# Patient Record
Sex: Female | Born: 1985 | Race: White | Hispanic: No | Marital: Single | Smoking: Current every day smoker
Health system: Southern US, Community
[De-identification: ages and names within clinical notes are randomized; demographics above are authoritative.]

## PROBLEM LIST (undated history)

## (undated) DIAGNOSIS — R51 Headache: Secondary | ICD-10-CM

## (undated) DIAGNOSIS — N200 Calculus of kidney: Secondary | ICD-10-CM

## (undated) DIAGNOSIS — F32A Depression, unspecified: Secondary | ICD-10-CM

## (undated) DIAGNOSIS — F329 Major depressive disorder, single episode, unspecified: Secondary | ICD-10-CM

## (undated) DIAGNOSIS — N159 Renal tubulo-interstitial disease, unspecified: Secondary | ICD-10-CM

## (undated) DIAGNOSIS — F53 Postpartum depression: Secondary | ICD-10-CM

## (undated) DIAGNOSIS — N390 Urinary tract infection, site not specified: Secondary | ICD-10-CM

## (undated) DIAGNOSIS — F4310 Post-traumatic stress disorder, unspecified: Secondary | ICD-10-CM

## (undated) DIAGNOSIS — N3010 Interstitial cystitis (chronic) without hematuria: Secondary | ICD-10-CM

## (undated) DIAGNOSIS — F419 Anxiety disorder, unspecified: Secondary | ICD-10-CM

## (undated) DIAGNOSIS — O99345 Other mental disorders complicating the puerperium: Secondary | ICD-10-CM

## (undated) DIAGNOSIS — C539 Malignant neoplasm of cervix uteri, unspecified: Secondary | ICD-10-CM

## (undated) HISTORY — PX: CERVIX SURGERY: SHX593

## (undated) HISTORY — DX: Major depressive disorder, single episode, unspecified: F32.9

## (undated) HISTORY — DX: Urinary tract infection, site not specified: N39.0

## (undated) HISTORY — PX: BLADDER SURGERY: SHX569

## (undated) HISTORY — DX: Headache: R51

## (undated) HISTORY — DX: Depression, unspecified: F32.A

---

## 2004-12-11 ENCOUNTER — Ambulatory Visit: Payer: Self-pay | Admitting: Pulmonary Disease

## 2005-06-24 ENCOUNTER — Emergency Department (HOSPITAL_COMMUNITY): Admission: EM | Admit: 2005-06-24 | Discharge: 2005-06-24 | Payer: Self-pay | Admitting: Emergency Medicine

## 2007-06-20 ENCOUNTER — Emergency Department (HOSPITAL_COMMUNITY): Admission: EM | Admit: 2007-06-20 | Discharge: 2007-06-21 | Payer: Self-pay | Admitting: Emergency Medicine

## 2008-03-28 ENCOUNTER — Ambulatory Visit: Payer: Self-pay | Admitting: Psychiatry

## 2008-03-28 ENCOUNTER — Emergency Department (HOSPITAL_COMMUNITY): Admission: EM | Admit: 2008-03-28 | Discharge: 2008-03-28 | Payer: Self-pay | Admitting: Emergency Medicine

## 2008-03-28 ENCOUNTER — Inpatient Hospital Stay (HOSPITAL_COMMUNITY): Admission: RE | Admit: 2008-03-28 | Discharge: 2008-04-04 | Payer: Self-pay | Admitting: Psychiatry

## 2008-04-29 ENCOUNTER — Emergency Department (HOSPITAL_BASED_OUTPATIENT_CLINIC_OR_DEPARTMENT_OTHER): Admission: EM | Admit: 2008-04-29 | Discharge: 2008-04-29 | Payer: Self-pay | Admitting: Emergency Medicine

## 2008-05-12 ENCOUNTER — Ambulatory Visit: Payer: Self-pay | Admitting: Diagnostic Radiology

## 2008-05-12 ENCOUNTER — Emergency Department (HOSPITAL_BASED_OUTPATIENT_CLINIC_OR_DEPARTMENT_OTHER): Admission: EM | Admit: 2008-05-12 | Discharge: 2008-05-12 | Payer: Self-pay | Admitting: Emergency Medicine

## 2008-06-27 ENCOUNTER — Emergency Department (HOSPITAL_BASED_OUTPATIENT_CLINIC_OR_DEPARTMENT_OTHER): Admission: EM | Admit: 2008-06-27 | Discharge: 2008-06-27 | Payer: Self-pay | Admitting: Emergency Medicine

## 2008-07-16 ENCOUNTER — Emergency Department (HOSPITAL_COMMUNITY): Admission: EM | Admit: 2008-07-16 | Discharge: 2008-07-16 | Payer: Self-pay | Admitting: Emergency Medicine

## 2008-08-01 ENCOUNTER — Emergency Department (HOSPITAL_BASED_OUTPATIENT_CLINIC_OR_DEPARTMENT_OTHER): Admission: EM | Admit: 2008-08-01 | Discharge: 2008-08-01 | Payer: Self-pay | Admitting: Emergency Medicine

## 2008-08-26 ENCOUNTER — Emergency Department (HOSPITAL_BASED_OUTPATIENT_CLINIC_OR_DEPARTMENT_OTHER): Admission: EM | Admit: 2008-08-26 | Discharge: 2008-08-26 | Payer: Self-pay | Admitting: Emergency Medicine

## 2008-10-08 ENCOUNTER — Emergency Department (HOSPITAL_BASED_OUTPATIENT_CLINIC_OR_DEPARTMENT_OTHER): Admission: EM | Admit: 2008-10-08 | Discharge: 2008-10-08 | Payer: Self-pay | Admitting: Emergency Medicine

## 2008-10-08 ENCOUNTER — Ambulatory Visit: Payer: Self-pay | Admitting: Diagnostic Radiology

## 2008-10-25 ENCOUNTER — Emergency Department (HOSPITAL_BASED_OUTPATIENT_CLINIC_OR_DEPARTMENT_OTHER): Admission: EM | Admit: 2008-10-25 | Discharge: 2008-10-25 | Payer: Self-pay | Admitting: Emergency Medicine

## 2008-12-02 ENCOUNTER — Emergency Department (HOSPITAL_BASED_OUTPATIENT_CLINIC_OR_DEPARTMENT_OTHER): Admission: EM | Admit: 2008-12-02 | Discharge: 2008-12-02 | Payer: Self-pay | Admitting: Emergency Medicine

## 2008-12-06 ENCOUNTER — Emergency Department (HOSPITAL_BASED_OUTPATIENT_CLINIC_OR_DEPARTMENT_OTHER): Admission: EM | Admit: 2008-12-06 | Discharge: 2008-12-06 | Payer: Self-pay | Admitting: Emergency Medicine

## 2008-12-27 ENCOUNTER — Emergency Department (HOSPITAL_BASED_OUTPATIENT_CLINIC_OR_DEPARTMENT_OTHER): Admission: EM | Admit: 2008-12-27 | Discharge: 2008-12-27 | Payer: Self-pay | Admitting: Emergency Medicine

## 2009-01-13 ENCOUNTER — Emergency Department (HOSPITAL_BASED_OUTPATIENT_CLINIC_OR_DEPARTMENT_OTHER): Admission: EM | Admit: 2009-01-13 | Discharge: 2009-01-13 | Payer: Self-pay | Admitting: Emergency Medicine

## 2009-10-01 ENCOUNTER — Emergency Department (HOSPITAL_BASED_OUTPATIENT_CLINIC_OR_DEPARTMENT_OTHER): Admission: EM | Admit: 2009-10-01 | Discharge: 2009-10-01 | Payer: Self-pay | Admitting: Emergency Medicine

## 2009-10-15 ENCOUNTER — Encounter (INDEPENDENT_AMBULATORY_CARE_PROVIDER_SITE_OTHER): Payer: Self-pay | Admitting: Family Medicine

## 2009-10-15 ENCOUNTER — Ambulatory Visit: Payer: Self-pay | Admitting: Internal Medicine

## 2009-10-15 LAB — CONVERTED CEMR LAB
ALT: 47 units/L — ABNORMAL HIGH (ref 0–35)
AST: 32 units/L (ref 0–37)
Albumin: 4.6 g/dL (ref 3.5–5.2)
Alkaline Phosphatase: 92 units/L (ref 39–117)
BUN: 10 mg/dL (ref 6–23)
Basophils Absolute: 0 10*3/uL (ref 0.0–0.1)
Basophils Relative: 0 % (ref 0–1)
CO2: 27 meq/L (ref 19–32)
Calcium: 9.4 mg/dL (ref 8.4–10.5)
Chlamydia, Swab/Urine, PCR: NEGATIVE
Chloride: 103 meq/L (ref 96–112)
Creatinine, Ser: 0.85 mg/dL (ref 0.40–1.20)
Eosinophils Absolute: 0.4 10*3/uL (ref 0.0–0.7)
Eosinophils Relative: 5 % (ref 0–5)
GC Probe Amp, Urine: NEGATIVE
Glucose, Bld: 87 mg/dL (ref 70–99)
HCT: 36.3 % (ref 36.0–46.0)
HCV Ab: NEGATIVE
Hemoglobin: 12.2 g/dL (ref 12.0–15.0)
Hep A Total Ab: NEGATIVE
Hep B Core Total Ab: NEGATIVE
Hep B S Ab: NEGATIVE
Lymphocytes Relative: 38 % (ref 12–46)
Lymphs Abs: 2.9 10*3/uL (ref 0.7–4.0)
MCHC: 33.6 g/dL (ref 30.0–36.0)
MCV: 83.1 fL (ref 78.0–100.0)
Monocytes Absolute: 0.7 10*3/uL (ref 0.1–1.0)
Monocytes Relative: 9 % (ref 3–12)
Neutro Abs: 3.7 10*3/uL (ref 1.7–7.7)
Neutrophils Relative %: 48 % (ref 43–77)
Platelets: 304 10*3/uL (ref 150–400)
Potassium: 4.2 meq/L (ref 3.5–5.3)
RBC: 4.37 M/uL (ref 3.87–5.11)
RDW: 12.7 % (ref 11.5–15.5)
Sodium: 139 meq/L (ref 135–145)
TSH: 1.778 microintl units/mL (ref 0.350–4.500)
Total Bilirubin: 0.4 mg/dL (ref 0.3–1.2)
Total Protein: 7.3 g/dL (ref 6.0–8.3)
WBC: 7.7 10*3/uL (ref 4.0–10.5)

## 2009-10-19 ENCOUNTER — Encounter: Admission: RE | Admit: 2009-10-19 | Discharge: 2009-10-19 | Payer: Self-pay | Admitting: Internal Medicine

## 2009-11-01 ENCOUNTER — Encounter: Admission: RE | Admit: 2009-11-01 | Discharge: 2009-11-01 | Payer: Self-pay | Admitting: Internal Medicine

## 2009-11-15 ENCOUNTER — Ambulatory Visit: Payer: Self-pay | Admitting: Internal Medicine

## 2009-11-30 ENCOUNTER — Ambulatory Visit: Payer: Self-pay | Admitting: Family Medicine

## 2009-12-01 ENCOUNTER — Encounter (INDEPENDENT_AMBULATORY_CARE_PROVIDER_SITE_OTHER): Payer: Self-pay | Admitting: Internal Medicine

## 2009-12-29 ENCOUNTER — Emergency Department (HOSPITAL_BASED_OUTPATIENT_CLINIC_OR_DEPARTMENT_OTHER): Admission: EM | Admit: 2009-12-29 | Discharge: 2009-12-29 | Payer: Self-pay | Admitting: Emergency Medicine

## 2009-12-29 ENCOUNTER — Ambulatory Visit: Payer: Self-pay | Admitting: Diagnostic Radiology

## 2010-01-18 ENCOUNTER — Encounter (INDEPENDENT_AMBULATORY_CARE_PROVIDER_SITE_OTHER): Payer: Self-pay | Admitting: *Deleted

## 2010-01-18 LAB — CONVERTED CEMR LAB
Chlamydia, DNA Probe: NEGATIVE
Chlamydia, Swab/Urine, PCR: NEGATIVE
GC Probe Amp, Genital: NEGATIVE
GC Probe Amp, Urine: NEGATIVE

## 2010-01-25 ENCOUNTER — Emergency Department (HOSPITAL_BASED_OUTPATIENT_CLINIC_OR_DEPARTMENT_OTHER)
Admission: EM | Admit: 2010-01-25 | Discharge: 2010-01-25 | Payer: Self-pay | Source: Home / Self Care | Admitting: Emergency Medicine

## 2010-02-09 ENCOUNTER — Emergency Department (HOSPITAL_BASED_OUTPATIENT_CLINIC_OR_DEPARTMENT_OTHER)
Admission: EM | Admit: 2010-02-09 | Discharge: 2010-02-09 | Payer: Self-pay | Source: Home / Self Care | Admitting: Emergency Medicine

## 2010-03-14 ENCOUNTER — Emergency Department (HOSPITAL_BASED_OUTPATIENT_CLINIC_OR_DEPARTMENT_OTHER)
Admission: EM | Admit: 2010-03-14 | Discharge: 2010-03-14 | Payer: Self-pay | Source: Home / Self Care | Admitting: Emergency Medicine

## 2010-05-12 ENCOUNTER — Emergency Department (HOSPITAL_BASED_OUTPATIENT_CLINIC_OR_DEPARTMENT_OTHER)
Admission: EM | Admit: 2010-05-12 | Discharge: 2010-05-12 | Disposition: A | Discharge status: Home / Self Care | Payer: Self-pay | Attending: Emergency Medicine | Admitting: Emergency Medicine

## 2010-05-12 DIAGNOSIS — J40 Bronchitis, not specified as acute or chronic: Secondary | ICD-10-CM | POA: Insufficient documentation

## 2010-05-12 DIAGNOSIS — R059 Cough, unspecified: Secondary | ICD-10-CM | POA: Insufficient documentation

## 2010-05-12 DIAGNOSIS — R05 Cough: Secondary | ICD-10-CM | POA: Insufficient documentation

## 2010-05-12 DIAGNOSIS — F17200 Nicotine dependence, unspecified, uncomplicated: Secondary | ICD-10-CM | POA: Insufficient documentation

## 2010-05-12 DIAGNOSIS — F319 Bipolar disorder, unspecified: Secondary | ICD-10-CM | POA: Insufficient documentation

## 2010-05-12 DIAGNOSIS — F411 Generalized anxiety disorder: Secondary | ICD-10-CM | POA: Insufficient documentation

## 2010-05-24 ENCOUNTER — Emergency Department (HOSPITAL_BASED_OUTPATIENT_CLINIC_OR_DEPARTMENT_OTHER)
Admission: EM | Admit: 2010-05-24 | Discharge: 2010-05-24 | Disposition: A | Discharge status: Home / Self Care | Payer: Self-pay | Attending: Emergency Medicine | Admitting: Emergency Medicine

## 2010-06-01 ENCOUNTER — Emergency Department (HOSPITAL_BASED_OUTPATIENT_CLINIC_OR_DEPARTMENT_OTHER)
Admission: EM | Admit: 2010-06-01 | Discharge: 2010-06-01 | Disposition: A | Discharge status: Home / Self Care | Payer: Self-pay | Attending: Emergency Medicine | Admitting: Emergency Medicine

## 2010-06-01 DIAGNOSIS — R350 Frequency of micturition: Secondary | ICD-10-CM | POA: Insufficient documentation

## 2010-06-01 DIAGNOSIS — Z3201 Encounter for pregnancy test, result positive: Secondary | ICD-10-CM | POA: Insufficient documentation

## 2010-06-01 LAB — URINALYSIS, ROUTINE W REFLEX MICROSCOPIC
Bilirubin Urine: NEGATIVE
Ketones, ur: 15 mg/dL — AB
Nitrite: NEGATIVE
Protein, ur: NEGATIVE mg/dL
Specific Gravity, Urine: 1.03 (ref 1.005–1.030)
pH: 6 (ref 5.0–8.0)

## 2010-06-01 LAB — PREGNANCY, URINE: Preg Test, Ur: POSITIVE

## 2010-06-03 LAB — CBC
HCT: 33.5 % — ABNORMAL LOW (ref 36.0–46.0)
Hemoglobin: 11.1 g/dL — ABNORMAL LOW (ref 12.0–15.0)
MCH: 28.5 pg (ref 26.0–34.0)
MCHC: 33.1 g/dL (ref 30.0–36.0)
MCV: 85.9 fL (ref 78.0–100.0)
Platelets: 341 10*3/uL (ref 150–400)
RBC: 3.9 MIL/uL (ref 3.87–5.11)
RDW: 14.3 % (ref 11.5–15.5)
WBC: 7.9 10*3/uL (ref 4.0–10.5)

## 2010-06-03 LAB — URINE CULTURE
Colony Count: 85000
Colony Count: >=100000
Culture  Setup Time: 201112230113
Culture  Setup Time: 201112230114

## 2010-06-03 LAB — URINALYSIS, ROUTINE W REFLEX MICROSCOPIC
Bilirubin Urine: NEGATIVE
Bilirubin Urine: NEGATIVE
Glucose, UA: NEGATIVE mg/dL
Glucose, UA: NEGATIVE mg/dL
Ketones, ur: 15 mg/dL — AB
Ketones, ur: NEGATIVE mg/dL
Nitrite: POSITIVE — AB
Nitrite: POSITIVE — AB
Protein, ur: NEGATIVE mg/dL
Protein, ur: NEGATIVE mg/dL
Specific Gravity, Urine: 1.008 (ref 1.005–1.030)
Specific Gravity, Urine: 1.014 (ref 1.005–1.030)
Urobilinogen, UA: 1 mg/dL (ref 0.0–1.0)
Urobilinogen, UA: 1 mg/dL (ref 0.0–1.0)
pH: 5.5 (ref 5.0–8.0)
pH: 6.5 (ref 5.0–8.0)

## 2010-06-03 LAB — BASIC METABOLIC PANEL
BUN: 13 mg/dL (ref 6–23)
CO2: 26 mEq/L (ref 19–32)
Calcium: 9.4 mg/dL (ref 8.4–10.5)
Chloride: 105 mEq/L (ref 96–112)
Creatinine, Ser: 0.8 mg/dL (ref 0.4–1.2)
GFR calc Af Amer: >60 mL/min (ref >60)
GFR calc non Af Amer: >60 mL/min (ref >60)
Glucose, Bld: 101 mg/dL — ABNORMAL HIGH (ref 70–99)
Potassium: 4.6 mEq/L (ref 3.5–5.1)
Sodium: 143 mEq/L (ref 135–145)

## 2010-06-03 LAB — URINE MICROSCOPIC-ADD ON

## 2010-06-03 LAB — DIFFERENTIAL
Basophils Absolute: 0 10*3/uL (ref 0.0–0.1)
Basophils Relative: 0 % (ref 0–1)
Eosinophils Absolute: 0.3 10*3/uL (ref 0.0–0.7)
Eosinophils Relative: 4 % (ref 0–5)
Lymphocytes Relative: 40 % (ref 12–46)
Lymphs Abs: 3.2 10*3/uL (ref 0.7–4.0)
Monocytes Absolute: 0.5 10*3/uL (ref 0.1–1.0)
Monocytes Relative: 7 % (ref 3–12)
Neutro Abs: 3.8 10*3/uL (ref 1.7–7.7)
Neutrophils Relative %: 49 % (ref 43–77)

## 2010-06-03 LAB — PREGNANCY, URINE: Preg Test, Ur: NEGATIVE

## 2010-06-04 LAB — URINALYSIS, ROUTINE W REFLEX MICROSCOPIC
Bilirubin Urine: NEGATIVE
Glucose, UA: NEGATIVE mg/dL
Glucose, UA: NEGATIVE mg/dL
Hgb urine dipstick: NEGATIVE
Hgb urine dipstick: NEGATIVE
Ketones, ur: 15 mg/dL — AB
Ketones, ur: 15 mg/dL — AB
Nitrite: POSITIVE — AB
Protein, ur: NEGATIVE mg/dL
Protein, ur: NEGATIVE mg/dL
Specific Gravity, Urine: 1.017 (ref 1.005–1.030)
Urobilinogen, UA: 1 mg/dL (ref 0.0–1.0)
Urobilinogen, UA: 4 mg/dL — ABNORMAL HIGH (ref 0.0–1.0)
pH: 5.5 (ref 5.0–8.0)

## 2010-06-04 LAB — URINE CULTURE
Colony Count: NO GROWTH
Colony Count: NO GROWTH
Culture  Setup Time: 201111041817
Culture  Setup Time: 201111201132
Culture: NO GROWTH
Culture: NO GROWTH

## 2010-06-04 LAB — PREGNANCY, URINE: Preg Test, Ur: NEGATIVE

## 2010-06-04 LAB — URINE MICROSCOPIC-ADD ON

## 2010-06-06 LAB — DIFFERENTIAL
Eosinophils Absolute: 0.3 10*3/uL (ref 0.0–0.7)
Eosinophils Relative: 4 % (ref 0–5)
Lymphs Abs: 2.2 10*3/uL (ref 0.7–4.0)
Monocytes Relative: 7 % (ref 3–12)

## 2010-06-06 LAB — CBC
Hemoglobin: 12.1 g/dL (ref 12.0–15.0)
MCHC: 33.3 g/dL (ref 30.0–36.0)
RDW: 12.1 % (ref 11.5–15.5)
WBC: 6.6 10*3/uL (ref 4.0–10.5)

## 2010-06-06 LAB — COMPREHENSIVE METABOLIC PANEL
ALT: 34 U/L (ref 0–35)
AST: 28 U/L (ref 0–37)
Alkaline Phosphatase: 118 U/L — ABNORMAL HIGH (ref 39–117)
CO2: 26 mEq/L (ref 19–32)
Calcium: 9.5 mg/dL (ref 8.4–10.5)
GFR calc Af Amer: >60 mL/min (ref >60)
Potassium: 4.1 mEq/L (ref 3.5–5.1)
Sodium: 142 mEq/L (ref 135–145)
Total Protein: 7.4 g/dL (ref 6.0–8.3)

## 2010-06-06 LAB — URINE MICROSCOPIC-ADD ON

## 2010-06-06 LAB — URINALYSIS, ROUTINE W REFLEX MICROSCOPIC
Glucose, UA: NEGATIVE mg/dL
Ketones, ur: 15 mg/dL — AB
Protein, ur: NEGATIVE mg/dL
pH: 6 (ref 5.0–8.0)

## 2010-06-09 LAB — URINALYSIS, ROUTINE W REFLEX MICROSCOPIC
Glucose, UA: NEGATIVE mg/dL
Ketones, ur: 40 mg/dL — AB
pH: 5 (ref 5.0–8.0)

## 2010-06-09 LAB — URINE MICROSCOPIC-ADD ON

## 2010-06-27 LAB — URINALYSIS, ROUTINE W REFLEX MICROSCOPIC
Hgb urine dipstick: NEGATIVE
Protein, ur: 30 mg/dL — AB
Urobilinogen, UA: >8 mg/dL — ABNORMAL HIGH (ref 0.0–1.0)

## 2010-06-27 LAB — URINE CULTURE: Culture: NO GROWTH

## 2010-06-27 LAB — URINE MICROSCOPIC-ADD ON

## 2010-06-27 LAB — PREGNANCY, URINE: Preg Test, Ur: NEGATIVE

## 2010-06-28 LAB — URINALYSIS, ROUTINE W REFLEX MICROSCOPIC
Bilirubin Urine: NEGATIVE
Hgb urine dipstick: NEGATIVE
Protein, ur: NEGATIVE mg/dL
Urobilinogen, UA: 1 mg/dL (ref 0.0–1.0)

## 2010-06-28 LAB — URINE MICROSCOPIC-ADD ON

## 2010-06-29 LAB — URINE MICROSCOPIC-ADD ON

## 2010-06-29 LAB — PREGNANCY, URINE: Preg Test, Ur: NEGATIVE

## 2010-06-29 LAB — URINALYSIS, ROUTINE W REFLEX MICROSCOPIC
Nitrite: POSITIVE — AB
Specific Gravity, Urine: 1.016 (ref 1.005–1.030)
pH: 6.5 (ref 5.0–8.0)

## 2010-06-30 LAB — URINALYSIS, ROUTINE W REFLEX MICROSCOPIC
Glucose, UA: NEGATIVE mg/dL
Hgb urine dipstick: NEGATIVE
Ketones, ur: NEGATIVE mg/dL
Protein, ur: NEGATIVE mg/dL

## 2010-07-01 LAB — URINALYSIS, ROUTINE W REFLEX MICROSCOPIC
Nitrite: POSITIVE — AB
Specific Gravity, Urine: 1.045 — ABNORMAL HIGH (ref 1.005–1.030)
pH: 5.5 (ref 5.0–8.0)

## 2010-07-01 LAB — URINE MICROSCOPIC-ADD ON

## 2010-07-01 LAB — URINE CULTURE

## 2010-07-02 LAB — URINALYSIS, ROUTINE W REFLEX MICROSCOPIC
Nitrite: NEGATIVE
Specific Gravity, Urine: 1.005 (ref 1.005–1.030)
Urobilinogen, UA: 0.2 mg/dL (ref 0.0–1.0)

## 2010-07-02 LAB — PREGNANCY, URINE: Preg Test, Ur: NEGATIVE

## 2010-07-03 LAB — URINALYSIS, ROUTINE W REFLEX MICROSCOPIC
Glucose, UA: NEGATIVE mg/dL
Protein, ur: NEGATIVE mg/dL
Specific Gravity, Urine: 1.018 (ref 1.005–1.030)
pH: 6 (ref 5.0–8.0)

## 2010-07-03 LAB — RAPID URINE DRUG SCREEN, HOSP PERFORMED
Barbiturates: NOT DETECTED
Opiates: POSITIVE — AB

## 2010-07-03 LAB — CBC
Hemoglobin: 13.5 g/dL (ref 12.0–15.0)
MCHC: 34.2 g/dL (ref 30.0–36.0)
MCV: 85.8 fL (ref 78.0–100.0)
RBC: 4.6 MIL/uL (ref 3.87–5.11)
WBC: 8.9 10*3/uL (ref 4.0–10.5)

## 2010-07-03 LAB — DIFFERENTIAL
Basophils Relative: 0 % (ref 0–1)
Lymphs Abs: 1.8 10*3/uL (ref 0.7–4.0)
Monocytes Absolute: 0.6 10*3/uL (ref 0.1–1.0)
Monocytes Relative: 6 % (ref 3–12)
Neutro Abs: 6.5 10*3/uL (ref 1.7–7.7)

## 2010-07-03 LAB — URINE MICROSCOPIC-ADD ON

## 2010-07-03 LAB — BASIC METABOLIC PANEL
CO2: 27 mEq/L (ref 19–32)
Calcium: 9.4 mg/dL (ref 8.4–10.5)
Chloride: 115 mEq/L — ABNORMAL HIGH (ref 96–112)
GFR calc Af Amer: >60 mL/min (ref >60)
Sodium: 147 mEq/L — ABNORMAL HIGH (ref 135–145)

## 2010-07-08 LAB — URINALYSIS, ROUTINE W REFLEX MICROSCOPIC
Glucose, UA: NEGATIVE mg/dL
Glucose, UA: NEGATIVE mg/dL
Hgb urine dipstick: NEGATIVE
Nitrite: NEGATIVE
Protein, ur: NEGATIVE mg/dL
Specific Gravity, Urine: 1.014 (ref 1.005–1.030)
pH: 6 (ref 5.0–8.0)
pH: 6 (ref 5.0–8.0)

## 2010-07-08 LAB — BASIC METABOLIC PANEL
Chloride: 110 mEq/L (ref 96–112)
GFR calc Af Amer: >60 mL/min (ref >60)
GFR calc non Af Amer: >60 mL/min (ref >60)
Potassium: 4.1 mEq/L (ref 3.5–5.1)
Sodium: 141 mEq/L (ref 135–145)

## 2010-07-08 LAB — CBC
HCT: 40.2 % (ref 36.0–46.0)
MCV: 86.4 fL (ref 78.0–100.0)
Platelets: 242 10*3/uL (ref 150–400)
RBC: 4.65 MIL/uL (ref 3.87–5.11)
WBC: 9 10*3/uL (ref 4.0–10.5)

## 2010-07-08 LAB — RAPID URINE DRUG SCREEN, HOSP PERFORMED: Barbiturates: NOT DETECTED

## 2010-07-08 LAB — ETHANOL: Alcohol, Ethyl (B): <5 mg/dL (ref 0–10)

## 2010-07-08 LAB — TRICYCLICS SCREEN, URINE: TCA Scrn: NOT DETECTED

## 2010-07-08 LAB — DIFFERENTIAL
Eosinophils Absolute: 0.5 10*3/uL (ref 0.0–0.7)
Eosinophils Relative: 5 % (ref 0–5)
Lymphocytes Relative: 31 % (ref 12–46)
Lymphs Abs: 2.8 10*3/uL (ref 0.7–4.0)
Monocytes Relative: 5 % (ref 3–12)

## 2010-07-09 LAB — URINALYSIS, ROUTINE W REFLEX MICROSCOPIC
Nitrite: POSITIVE — AB
Specific Gravity, Urine: 1.01 (ref 1.005–1.030)
Urobilinogen, UA: 4 mg/dL — ABNORMAL HIGH (ref 0.0–1.0)
pH: 5 (ref 5.0–8.0)

## 2010-07-09 LAB — URINE MICROSCOPIC-ADD ON
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
## 2010-12-16 LAB — URINE MICROSCOPIC-ADD ON

## 2010-12-16 LAB — URINALYSIS, ROUTINE W REFLEX MICROSCOPIC
Hgb urine dipstick: NEGATIVE
Nitrite: NEGATIVE
Protein, ur: NEGATIVE
Urobilinogen, UA: 0.2

## 2010-12-16 LAB — DIFFERENTIAL
Eosinophils Absolute: 0.2
Lymphocytes Relative: 25
Lymphs Abs: 3.9
Neutro Abs: 10.4 — ABNORMAL HIGH
Neutrophils Relative %: 68

## 2010-12-16 LAB — RAPID URINE DRUG SCREEN, HOSP PERFORMED
Amphetamines: NOT DETECTED
Opiates: POSITIVE — AB
Tetrahydrocannabinol: NOT DETECTED

## 2010-12-16 LAB — BASIC METABOLIC PANEL
BUN: 8
Calcium: 9.1
Creatinine, Ser: <0.3 — ABNORMAL LOW
GFR calc non Af Amer: >60
Glucose, Bld: 51 — ABNORMAL LOW

## 2010-12-16 LAB — PREGNANCY, URINE: Preg Test, Ur: NEGATIVE

## 2010-12-16 LAB — CBC
Platelets: 267
RBC: 5.03
WBC: 15.4 — ABNORMAL HIGH

## 2010-12-16 LAB — ETHANOL: Alcohol, Ethyl (B): <5

## 2011-01-10 IMAGING — CR DG ANKLE COMPLETE 3+V*L*
4 series · 4 of 4 positions shown · non-contrast
Comparison: None

CLINICAL DATA: Fall with left ankle pain.

LEFT ANKLE COMPLETE - 3+ VIEW

[t ankle joint ap left (1 of 2)]
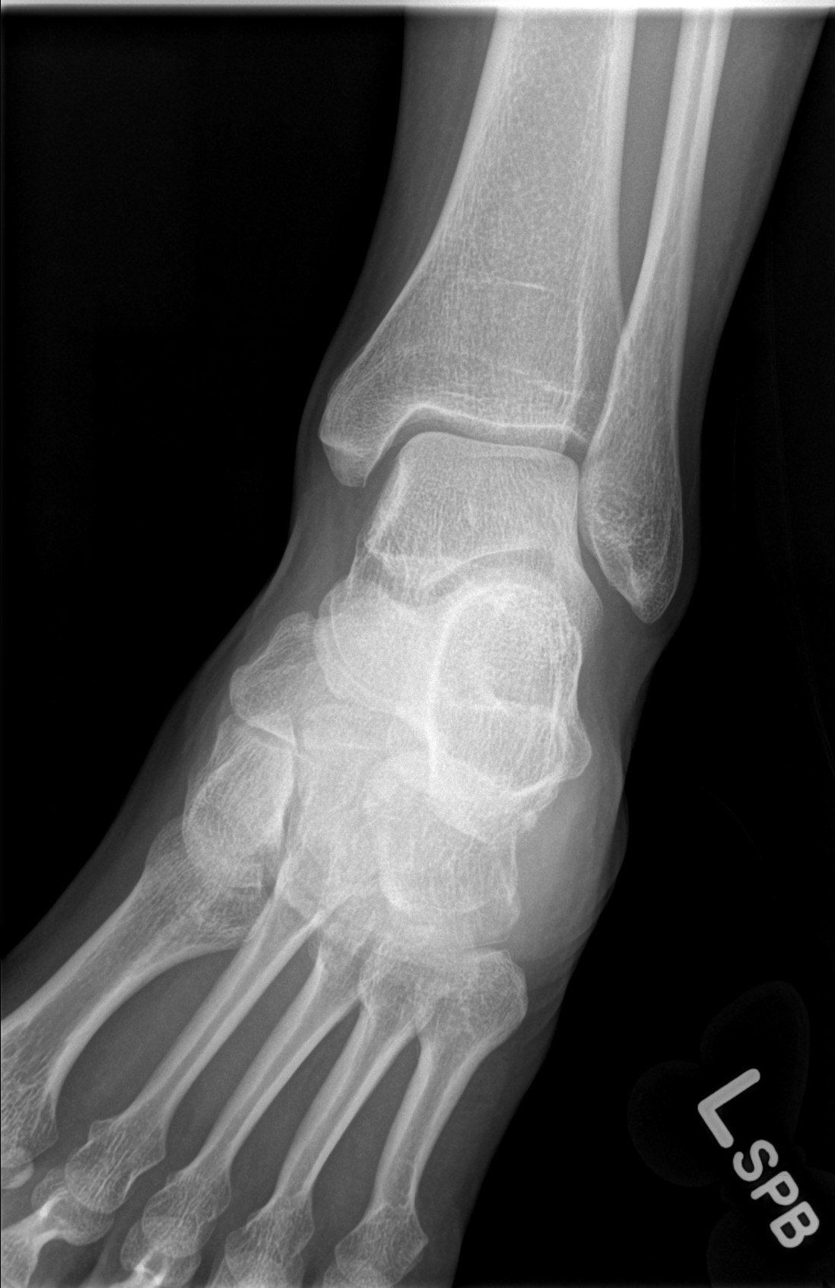

[t ankle joint oblique left]
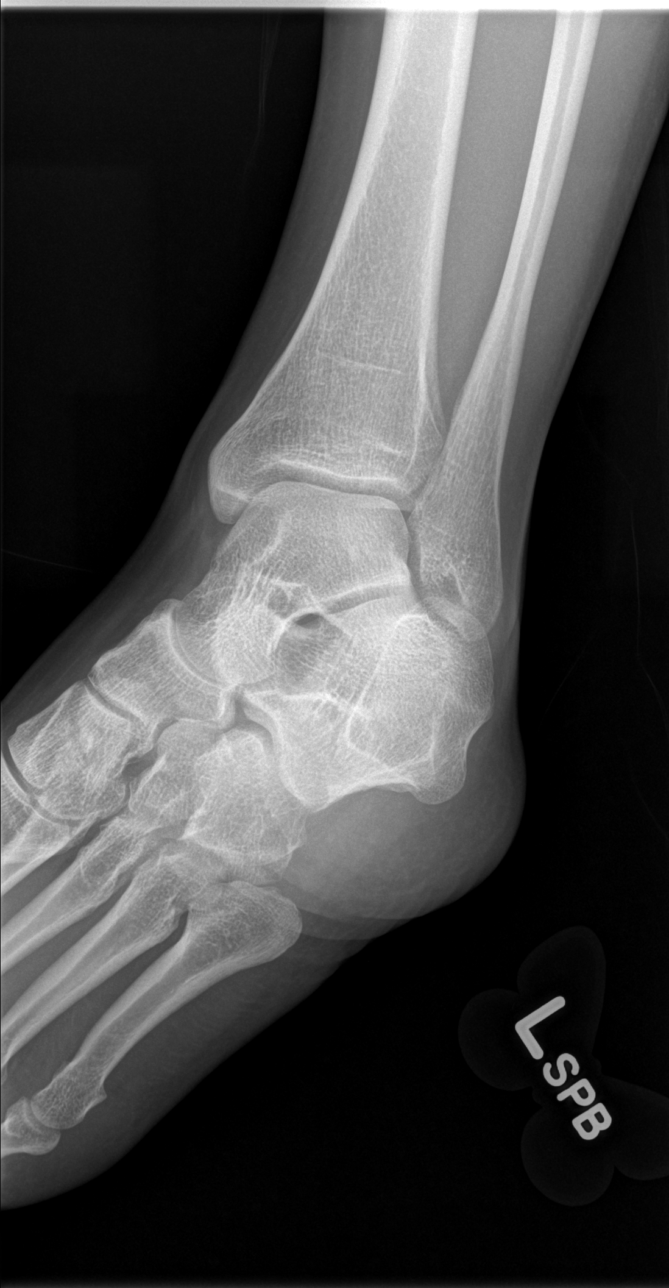

[t ankle joint ap left (2 of 2)]
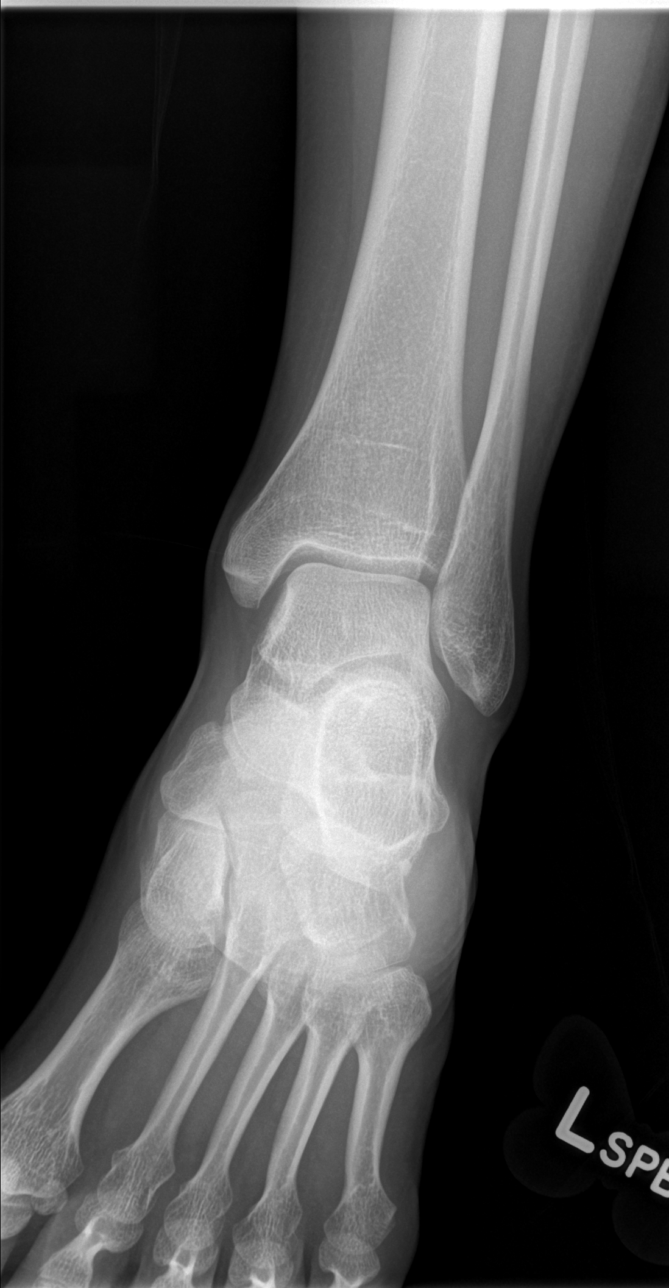

[t ankle joint lat left]
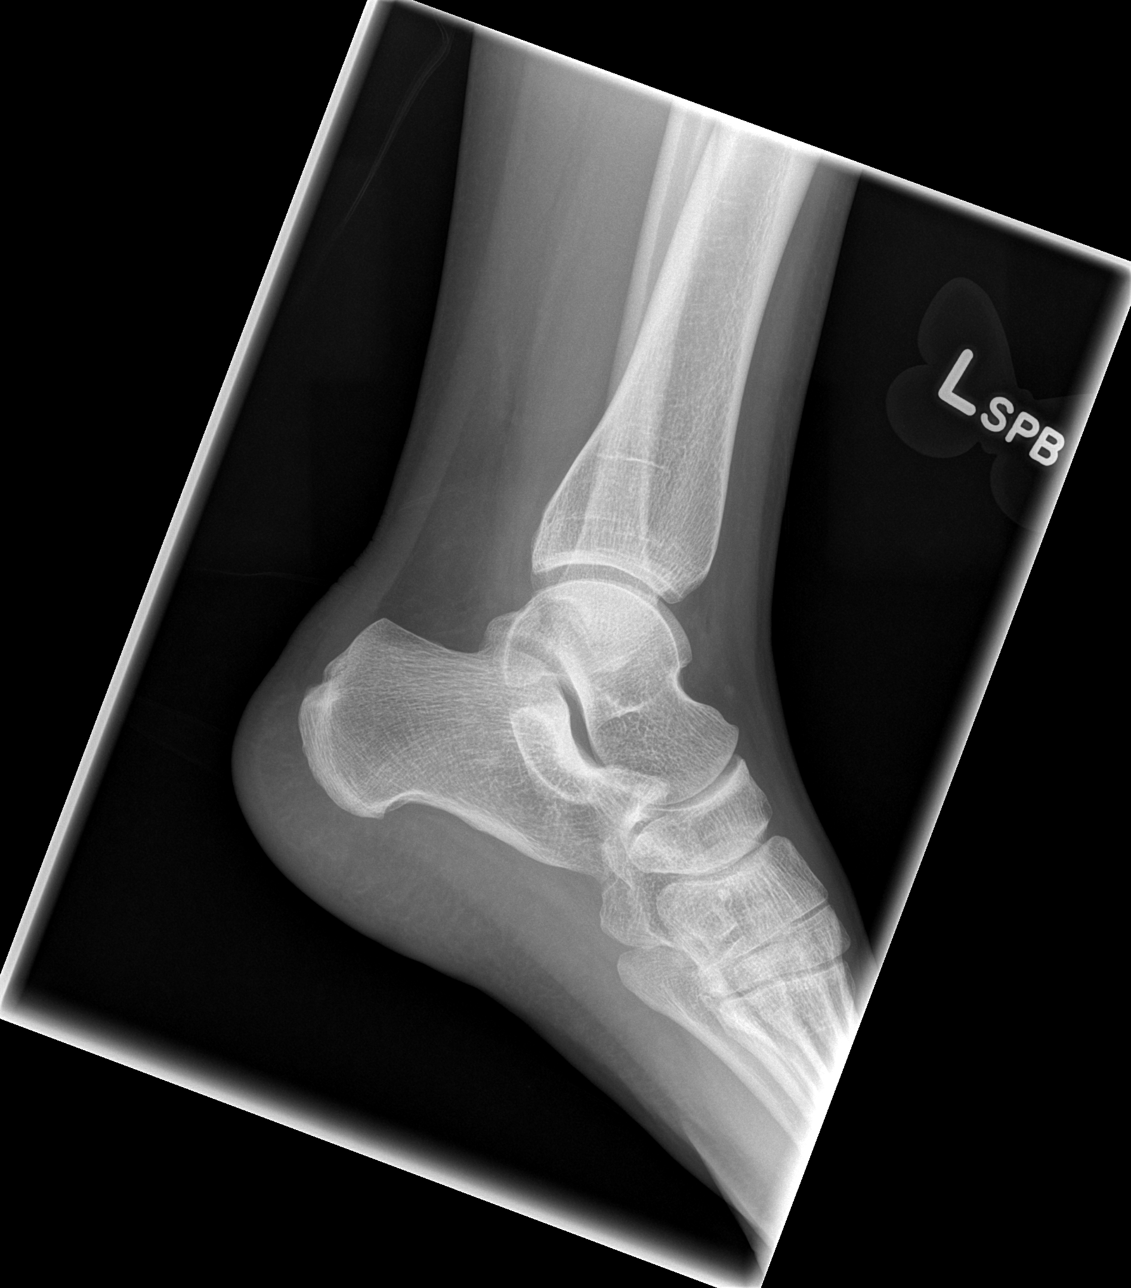

[4 of 4 positions shown; findings below may reference images not displayed]

FINDINGS: No evidence of acute fracture, subluxation or dislocation
identified.

No radio-opaque foreign bodies are present.

No focal bony lesions are noted.

The joint spaces are unremarkable.
IMPRESSION: No evidence of acute bony abnormality.

## 2011-04-14 ENCOUNTER — Emergency Department (HOSPITAL_BASED_OUTPATIENT_CLINIC_OR_DEPARTMENT_OTHER)
Admission: EM | Admit: 2011-04-14 | Discharge: 2011-04-14 | Disposition: A | Discharge status: Home / Self Care | Payer: Medicaid Other | Attending: Emergency Medicine | Admitting: Emergency Medicine

## 2011-04-14 ENCOUNTER — Encounter (HOSPITAL_BASED_OUTPATIENT_CLINIC_OR_DEPARTMENT_OTHER): Payer: Self-pay

## 2011-04-14 DIAGNOSIS — Z331 Pregnant state, incidental: Secondary | ICD-10-CM

## 2011-04-14 DIAGNOSIS — M545 Low back pain, unspecified: Secondary | ICD-10-CM | POA: Insufficient documentation

## 2011-04-14 DIAGNOSIS — M549 Dorsalgia, unspecified: Secondary | ICD-10-CM | POA: Insufficient documentation

## 2011-04-14 DIAGNOSIS — O2390 Unspecified genitourinary tract infection in pregnancy, unspecified trimester: Secondary | ICD-10-CM | POA: Insufficient documentation

## 2011-04-14 DIAGNOSIS — N390 Urinary tract infection, site not specified: Secondary | ICD-10-CM | POA: Insufficient documentation

## 2011-04-14 DIAGNOSIS — O210 Mild hyperemesis gravidarum: Secondary | ICD-10-CM | POA: Insufficient documentation

## 2011-04-14 HISTORY — DX: Malignant neoplasm of cervix uteri, unspecified: C53.9

## 2011-04-14 HISTORY — DX: Other mental disorders complicating the puerperium: O99.345

## 2011-04-14 HISTORY — DX: Calculus of kidney: N20.0

## 2011-04-14 HISTORY — DX: Renal tubulo-interstitial disease, unspecified: N15.9

## 2011-04-14 HISTORY — DX: Postpartum depression: F53.0

## 2011-04-14 LAB — URINALYSIS, ROUTINE W REFLEX MICROSCOPIC
Ketones, ur: 15 mg/dL — AB
Nitrite: POSITIVE — AB
Specific Gravity, Urine: 1.029 (ref 1.005–1.030)
pH: 5.5 (ref 5.0–8.0)

## 2011-04-14 LAB — URINE CULTURE: Culture  Setup Time: 201301211648

## 2011-04-14 LAB — PREGNANCY, URINE: Preg Test, Ur: POSITIVE

## 2011-04-14 LAB — URINE MICROSCOPIC-ADD ON

## 2011-04-14 MED ORDER — ONDANSETRON 8 MG PO TBDP
8.0000 mg | ORAL_TABLET | Freq: Once | ORAL | Status: AC
  Administered 2011-04-14: 8 mg via ORAL
  Filled 2011-04-14: qty 1

## 2011-04-14 MED ORDER — ONDANSETRON HCL 4 MG PO TABS: 8.0000 mg | ORAL_TABLET | Freq: Four times a day (QID) | ORAL | Status: AC

## 2011-04-14 MED ORDER — ONDANSETRON HCL 4 MG PO TABS: 8.0000 mg | ORAL_TABLET | Freq: Two times a day (BID) | ORAL | Status: AC | PRN

## 2011-04-14 MED ORDER — NITROFURANTOIN MONOHYD MACRO 100 MG PO CAPS
100.0000 mg | ORAL_CAPSULE | Freq: Once | ORAL | Status: AC
  Administered 2011-04-14: 100 mg via ORAL
  Filled 2011-04-14: qty 1

## 2011-04-14 MED ORDER — PRENATAL RX 60-1 MG PO TABS: 1.0000 | ORAL_TABLET | Freq: Every day | ORAL | Status: AC

## 2011-04-14 MED ORDER — NITROFURANTOIN MONOHYD MACRO 100 MG PO CAPS: 100.0000 mg | ORAL_CAPSULE | Freq: Two times a day (BID) | ORAL | Status: AC

## 2011-04-14 NOTE — ED Notes (Signed)
MD at bedside. 

## 2011-04-14 NOTE — ED Notes (Signed)
Pt reports urinary urgency, frequency, pain with urination, low back and low abdominal pain x 3 days.

## 2011-04-14 NOTE — ED Provider Notes (Signed)
History     CSN: [id number]  Arrival date & time 04/14/11  7846   First MD Initiated Contact with Patient 04/14/11 1137      Chief Complaint  Patient presents with  . Dysuria  . Back Pain    (Consider location/radiation/quality/duration/timing/severity/associated sxs/prior treatment) HPI Complains of dysuria urgency bilateral low back pain and suprapubic pain worse with urinating for one week. Symptoms feel like urinary tract infection she's had in the past. No treatment prior to coming here no fever. Admits to nausea, no vomiting No other associated symptoms. Past Medical History  Diagnosis Date  . Kidney infection   . Cervical cancer   . Post partum depression   . Kidney stones     Past Surgical History  Procedure Date  . Cervix surgery   . Bladder surgery     No family history on file.  History  Substance Use Topics  . Smoking status: Former Games developer  . Smokeless tobacco: Never Used  . Alcohol Use: No   current smoker  OB History    Grav Para Term Preterm Abortions TAB SAB Ect Mult Living                  Review of Systems  Constitutional: Negative.   HENT: Negative.   Respiratory: Negative.   Cardiovascular: Negative.   Gastrointestinal: Positive for nausea and abdominal pain. Negative for vomiting.  Genitourinary: Positive for dysuria. Negative for vaginal discharge.       Amenorrhea  Musculoskeletal: Positive for back pain.  Skin: Negative.   Neurological: Negative.   Hematological: Negative.   Psychiatric/Behavioral: Negative.   All other systems reviewed and are negative.    Allergies  Aspirin; Ciprofloxacin; and Sulfa antibiotics  Home Medications   Current Outpatient Rx  Name Route Sig Dispense Refill  . ESOMEPRAZOLE MAGNESIUM 40 MG PO CPDR Oral Take 40 mg by mouth daily before breakfast.    . METHADONE HCL 10 MG PO TABS Oral Take 100 mg by mouth every 8 (eight) hours.    . MULTIVITAMINS PO CAPS Oral Take 1 capsule by mouth daily.       BP 145/93  Pulse 79  Temp(Src) 97.1 F (36.2 C) (Oral)  Resp 16  Ht 5\' 8"  (1.727 m)  Wt 175 lb (79.379 kg)  BMI 26.61 kg/m2  SpO2 97%  Physical Exam  Constitutional: She appears well-developed and well-nourished.  HENT:  Head: Normocephalic and atraumatic.  Eyes: Conjunctivae are normal. Pupils are equal, round, and reactive to light.  Neck: Neck supple. No tracheal deviation present. No thyromegaly present.  Cardiovascular: Normal rate and regular rhythm.   No murmur heard. Pulmonary/Chest: Effort normal and breath sounds normal.  Abdominal: Soft. Bowel sounds are normal. She exhibits no distension. There is no tenderness.  Genitourinary:       No flank tenderness  Musculoskeletal: Normal range of motion. She exhibits no edema and no tenderness.  Neurological: She is alert. Coordination normal.  Skin: Skin is warm and dry. No rash noted.  Psychiatric: She has a normal mood and affect.    ED Course  Procedures (including critical care time)  Labs Reviewed  URINALYSIS, ROUTINE W REFLEX MICROSCOPIC - Abnormal; Notable for the following:    Color, Urine ORANGE (*) BIOCHEMICALS MAY BE AFFECTED BY COLOR   Bilirubin Urine SMALL (*)    Ketones, ur 15 (*)    Urobilinogen, UA 2.0 (*)    Nitrite POSITIVE (*)    Leukocytes, UA SMALL (*)  All other components within normal limits  URINE MICROSCOPIC-ADD ON - Abnormal; Notable for the following:    Bacteria, UA FEW (*)    All other components within normal limits  PREGNANCY, URINE   No results found.   No diagnosis found.    MDM  Pelvic ultrasound offered the patient which she declines. Spoke with Dr. Jacqulyn Bath (OB/GYN) who will see patient in the office this week, remote risk of ectopic pregnancy but doubtful.  Plan urine culture prescription Macrobid, Zofran, prenatal vitamin. Patient advised to stop smoking Diagnosis #1 UTI #2 pregnancy        Doug Sou, MD 04/14/11 1209

## 2011-04-26 ENCOUNTER — Emergency Department (HOSPITAL_BASED_OUTPATIENT_CLINIC_OR_DEPARTMENT_OTHER)
Admission: EM | Admit: 2011-04-26 | Discharge: 2011-04-26 | Disposition: A | Discharge status: Home / Self Care | Payer: Medicaid Other | Attending: Emergency Medicine | Admitting: Emergency Medicine

## 2011-04-26 ENCOUNTER — Encounter (HOSPITAL_BASED_OUTPATIENT_CLINIC_OR_DEPARTMENT_OTHER): Payer: Self-pay | Admitting: Emergency Medicine

## 2011-04-26 DIAGNOSIS — O2390 Unspecified genitourinary tract infection in pregnancy, unspecified trimester: Secondary | ICD-10-CM | POA: Insufficient documentation

## 2011-04-26 DIAGNOSIS — N390 Urinary tract infection, site not specified: Secondary | ICD-10-CM | POA: Insufficient documentation

## 2011-04-26 DIAGNOSIS — R300 Dysuria: Secondary | ICD-10-CM | POA: Insufficient documentation

## 2011-04-26 DIAGNOSIS — O2340 Unspecified infection of urinary tract in pregnancy, unspecified trimester: Secondary | ICD-10-CM

## 2011-04-26 LAB — URINALYSIS, MICROSCOPIC ONLY
Bilirubin Urine: NEGATIVE
Ketones, ur: NEGATIVE mg/dL
Nitrite: NEGATIVE
Specific Gravity, Urine: 1.023 (ref 1.005–1.030)
Urobilinogen, UA: 0.2 mg/dL (ref 0.0–1.0)

## 2011-04-26 LAB — PREGNANCY, URINE: Preg Test, Ur: POSITIVE — AB

## 2011-04-26 MED ORDER — NITROFURANTOIN MONOHYD MACRO 100 MG PO CAPS
100.0000 mg | ORAL_CAPSULE | Freq: Once | ORAL | Status: AC
  Administered 2011-04-26: 100 mg via ORAL
  Filled 2011-04-26: qty 1

## 2011-04-26 MED ORDER — NITROFURANTOIN MONOHYD MACRO 100 MG PO CAPS: 100.0000 mg | ORAL_CAPSULE | Freq: Once | ORAL | Status: AC

## 2011-04-26 NOTE — ED Provider Notes (Signed)
History     CSN: [id number]  Arrival date & time 04/26/11  7829   First MD Initiated Contact with Patient 04/26/11 1033      Chief Complaint  Patient presents with  . Dysuria    HPI The patient presents one week after being diagnosed with a urinary tract infection, now with request to be sure that her condition is improving.  She notes that over the past week she's been compliant with her medications, including Macrobid.  She notes that her abdominal discomfort, or polyuria, or dysuria at all decreased.  She notes no new fevers, no chills, no new vomiting or diarrhea.  No vaginal discharge or bleeding.  Notably, the patient is pregnant.  This is her second pregnancy.  Past Medical History  Diagnosis Date  . Kidney infection   . Cervical cancer   . Post partum depression   . Kidney stones     Past Surgical History  Procedure Date  . Cervix surgery   . Bladder surgery     History reviewed. No pertinent family history.  History  Substance Use Topics  . Smoking status: Former Games developer  . Smokeless tobacco: Never Used  . Alcohol Use: No    OB History    Grav Para Term Preterm Abortions TAB SAB Ect Mult Living                  Review of Systems  Constitutional:       HPI  HENT:       HPI otherwise negative  Eyes: Negative.   Respiratory:       HPI, otherwise negative  Cardiovascular:       HPI, otherwise nmegative  Gastrointestinal: Negative for vomiting.  Genitourinary:       HPI, otherwise negative  Musculoskeletal:       HPI, otherwise negative  Skin: Negative.   Neurological: Negative for syncope.    Allergies  Aspirin; Ciprofloxacin; and Sulfa antibiotics  Home Medications   Current Outpatient Rx  Name Route Sig Dispense Refill  . NITROFURANTOIN MONOHYD MACRO 100 MG PO CAPS Oral Take 100 mg by mouth 2 (two) times daily.    Marland Kitchen ESOMEPRAZOLE MAGNESIUM 40 MG PO CPDR Oral Take 40 mg by mouth daily before breakfast.    . METHADONE HCL 10 MG PO TABS Oral  Take 100 mg by mouth every 8 (eight) hours.    . MULTIVITAMINS PO CAPS Oral Take 1 capsule by mouth daily.    Marland Kitchen NITROFURANTOIN MONOHYD MACRO 100 MG PO CAPS Oral Take 1 capsule (100 mg total) by mouth once. 7 capsule 0  . PRENATAL RX 60-1 MG PO TABS Oral Take 1 tablet by mouth daily. 30 tablet 0    BP 125/83  Pulse 72  Temp(Src) 98.1 F (36.7 C) (Oral)  Resp 16  Ht 5\' 8"  (1.727 m)  Wt 170 lb (77.111 kg)  BMI 25.85 kg/m2  SpO2 100%  Physical Exam  Nursing note and vitals reviewed. Constitutional: She is oriented to person, place, and time. She appears well-developed and well-nourished. No distress.  HENT:  Head: Normocephalic and atraumatic.  Eyes: Conjunctivae and EOM are normal.  Cardiovascular: Normal rate and regular rhythm.   Pulmonary/Chest: Effort normal. No stridor. She has no wheezes. She has no rales.  Abdominal: There is no tenderness.       Patient describes suprapubic tenderness without palpation, but on exam has no tenderness to palpation  Musculoskeletal: She exhibits no edema and no tenderness.  Neurological: She is alert and oriented to person, place, and time. No cranial nerve deficit. She exhibits normal muscle tone. Coordination normal.  Skin: Skin is warm and dry.  Psychiatric: She has a normal mood and affect.    ED Course  Procedures (including critical care time)  Labs Reviewed  PREGNANCY, URINE - Abnormal; Notable for the following:    Preg Test, Ur POSITIVE (*)    All other components within normal limits  URINALYSIS, WITH MICROSCOPIC - Abnormal; Notable for the following:    APPearance CLOUDY (*)    Leukocytes, UA SMALL (*)    Bacteria, UA MANY (*)    Squamous Epithelial / LPF MANY (*)    All other components within normal limits   No results found.   1. Urinary tract infection in pregnancy, antepartum       MDM  This well-appearing g2p1 female now presents with concerns over resolving urinary tract infection.  On exam she is in no  distress, and her description of increasing symptoms his reassuring.  Her urine shows mild leukocytes.  Absent fever, evidence of distress, abnormal vital signs, her antibiotic course will be continued.  A discussion was conducted regarding the necessity for continued OB evaluation and management of her pregnancy.  She was provided return precautions as well.        Gerhard Munch, MD 04/26/11 [phone]

## 2011-04-26 NOTE — ED Notes (Signed)
Pt seen on 04/14/11 for UTI.  Pt continues to have dysuria, back pain and lower abd discomfort.  Pt also states she has burning sensation in pelvic area which has not subsided.  No vaginal discharge.  Pt states she is currently pregnant.

## 2011-07-18 ENCOUNTER — Emergency Department (HOSPITAL_BASED_OUTPATIENT_CLINIC_OR_DEPARTMENT_OTHER)
Admission: EM | Admit: 2011-07-18 | Discharge: 2011-07-18 | Disposition: A | Discharge status: Home / Self Care | Payer: Medicaid Other | Attending: Emergency Medicine | Admitting: Emergency Medicine

## 2011-07-18 ENCOUNTER — Encounter (HOSPITAL_BASED_OUTPATIENT_CLINIC_OR_DEPARTMENT_OTHER): Payer: Self-pay | Admitting: *Deleted

## 2011-07-18 DIAGNOSIS — R1110 Vomiting, unspecified: Secondary | ICD-10-CM | POA: Insufficient documentation

## 2011-07-18 DIAGNOSIS — R109 Unspecified abdominal pain: Secondary | ICD-10-CM | POA: Insufficient documentation

## 2011-07-18 DIAGNOSIS — R197 Diarrhea, unspecified: Secondary | ICD-10-CM | POA: Insufficient documentation

## 2011-07-18 DIAGNOSIS — IMO0001 Reserved for inherently not codable concepts without codable children: Secondary | ICD-10-CM | POA: Insufficient documentation

## 2011-07-18 DIAGNOSIS — R6883 Chills (without fever): Secondary | ICD-10-CM | POA: Insufficient documentation

## 2011-07-18 LAB — PREGNANCY, URINE: Preg Test, Ur: NEGATIVE

## 2011-07-18 LAB — URINE MICROSCOPIC-ADD ON

## 2011-07-18 LAB — URINALYSIS, ROUTINE W REFLEX MICROSCOPIC
Glucose, UA: NEGATIVE mg/dL
Hgb urine dipstick: NEGATIVE
Specific Gravity, Urine: 1.028 (ref 1.005–1.030)

## 2011-07-18 LAB — BASIC METABOLIC PANEL
CO2: 23 mEq/L (ref 19–32)
Chloride: 103 mEq/L (ref 96–112)
Sodium: 141 mEq/L (ref 135–145)

## 2011-07-18 MED ORDER — ONDANSETRON HCL 4 MG/2ML IJ SOLN
INTRAMUSCULAR | Status: AC
  Administered 2011-07-18: 4 mg via INTRAVENOUS
  Filled 2011-07-18: qty 2

## 2011-07-18 MED ORDER — SODIUM CHLORIDE 0.9 % IV BOLUS (SEPSIS)
1000.0000 mL | Freq: Once | INTRAVENOUS | Status: AC
  Administered 2011-07-18: 1000 mL via INTRAVENOUS

## 2011-07-18 MED ORDER — PROMETHAZINE HCL 25 MG/ML IJ SOLN
12.5000 mg | Freq: Once | INTRAMUSCULAR | Status: AC
  Administered 2011-07-18: 12.5 mg via INTRAVENOUS
  Filled 2011-07-18: qty 1

## 2011-07-18 MED ORDER — PROMETHAZINE HCL 25 MG PO TABS: 25.0000 mg | ORAL_TABLET | Freq: Four times a day (QID) | ORAL | Status: DC | PRN

## 2011-07-18 MED ORDER — ONDANSETRON HCL 4 MG/2ML IJ SOLN
4.0000 mg | Freq: Once | INTRAMUSCULAR | Status: AC
  Administered 2011-07-18: 4 mg via INTRAVENOUS

## 2011-07-18 MED ORDER — ONDANSETRON 4 MG PO TBDP: 4.0000 mg | ORAL_TABLET | Freq: Once | ORAL | Status: DC

## 2011-07-18 NOTE — ED Notes (Signed)
Vomiting x 12 hours. Diarrhea.
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
## 2011-07-18 NOTE — Discharge Instructions (Signed)
B.R.A.T. Diet Your doctor has recommended the B.R.A.T. diet for you or your child until the condition improves. This is often used to help control diarrhea and vomiting symptoms. If you or your child can tolerate clear liquids, you may have:  Bananas.   Rice.   Applesauce.   Toast (and other simple starches such as crackers, potatoes, noodles).  Be sure to avoid dairy products, meats, and fatty foods until symptoms are better. Fruit juices such as apple, grape, and prune juice can make diarrhea worse. Avoid these. Continue this diet for 2 days or as instructed by your caregiver. Document Released: 03/10/2005 Document Revised: 02/27/2011 Document Reviewed: 08/27/2006 Exit[employer] Patient Information 2012 [employer]. 

## 2011-07-20 NOTE — ED Provider Notes (Signed)
Medical screening examination/treatment/procedure(s) were performed by non-physician practitioner and as supervising physician I was immediately available for consultation/collaboration.  Amea Mcphail, MD 07/20/11 1009 

## 2011-12-06 ENCOUNTER — Encounter (HOSPITAL_BASED_OUTPATIENT_CLINIC_OR_DEPARTMENT_OTHER): Payer: Self-pay | Admitting: *Deleted

## 2011-12-06 ENCOUNTER — Emergency Department (HOSPITAL_BASED_OUTPATIENT_CLINIC_OR_DEPARTMENT_OTHER): Payer: Medicaid Other

## 2011-12-06 ENCOUNTER — Emergency Department (HOSPITAL_BASED_OUTPATIENT_CLINIC_OR_DEPARTMENT_OTHER)
Admission: EM | Admit: 2011-12-06 | Discharge: 2011-12-06 | Disposition: A | Discharge status: Home / Self Care | Payer: Medicaid Other | Attending: Emergency Medicine | Admitting: Emergency Medicine

## 2011-12-06 DIAGNOSIS — Z882 Allergy status to sulfonamides status: Secondary | ICD-10-CM | POA: Insufficient documentation

## 2011-12-06 DIAGNOSIS — Z87442 Personal history of urinary calculi: Secondary | ICD-10-CM | POA: Insufficient documentation

## 2011-12-06 DIAGNOSIS — F17200 Nicotine dependence, unspecified, uncomplicated: Secondary | ICD-10-CM | POA: Insufficient documentation

## 2011-12-06 DIAGNOSIS — Z8541 Personal history of malignant neoplasm of cervix uteri: Secondary | ICD-10-CM | POA: Insufficient documentation

## 2011-12-06 DIAGNOSIS — J40 Bronchitis, not specified as acute or chronic: Secondary | ICD-10-CM

## 2011-12-06 MED ORDER — PREDNISONE 10 MG PO TABS: 40.0000 mg | ORAL_TABLET | Freq: Every day | ORAL | Status: DC

## 2011-12-06 MED ORDER — DOXYCYCLINE HYCLATE 100 MG PO CAPS: 100.0000 mg | ORAL_CAPSULE | Freq: Two times a day (BID) | ORAL | Status: AC

## 2011-12-06 MED ORDER — ALBUTEROL SULFATE HFA 108 (90 BASE) MCG/ACT IN AERS: 1.0000 | INHALATION_SPRAY | Freq: Four times a day (QID) | RESPIRATORY_TRACT | Status: AC | PRN

## 2011-12-06 MED ORDER — PREDNISONE 50 MG PO TABS
60.0000 mg | ORAL_TABLET | Freq: Once | ORAL | Status: AC
  Administered 2011-12-06: 60 mg via ORAL
  Filled 2011-12-06: qty 1

## 2011-12-06 NOTE — ED Provider Notes (Signed)
History     CSN: [id number]  Arrival date & time 12/06/11  1050   First MD Initiated Contact with Patient 12/06/11 1120      Chief Complaint  Patient presents with  . Nasal Congestion    (Consider location/radiation/quality/duration/timing/severity/associated sxs/prior treatment) The history is provided by the patient.   patient is a 26 year old female with a two-week history of upper rest for infection persistent cough occasional fever nasal congestion. Cough is most part is nonproductive. Other family members have similar illness. Patient's concern that she may have pneumonia. Associated with some chest wall soreness but no significant chest pain.  Past Medical History  Diagnosis Date  . Kidney infection   . Cervical cancer   . Post partum depression   . Kidney stones     Past Surgical History  Procedure Date  . Cervix surgery   . Bladder surgery     No family history on file.  History  Substance Use Topics  . Smoking status: Current Every Day Smoker  . Smokeless tobacco: Never Used  . Alcohol Use: No    OB History    Grav Para Term Preterm Abortions TAB SAB Ect Mult Living                  Review of Systems  Constitutional: Negative for fever.  HENT: Positive for congestion. Negative for neck pain.   Respiratory: Positive for cough. Negative for shortness of breath.   Cardiovascular: Negative for chest pain.  Gastrointestinal: Negative for nausea, vomiting and abdominal pain.  Genitourinary: Negative for dysuria.  Musculoskeletal: Negative for back pain.  Skin: Negative for rash.  Neurological: Negative for headaches.  Hematological: Does not bruise/bleed easily.    Allergies  Aspirin; Ciprofloxacin; and Sulfa antibiotics  Home Medications   Current Outpatient Rx  Name Route Sig Dispense Refill  . ALBUTEROL SULFATE HFA 108 (90 BASE) MCG/ACT IN AERS Inhalation Inhale 1-2 puffs into the lungs every 6 (six) hours as needed for wheezing. 1 Inhaler 0    . DOXYCYCLINE HYCLATE 100 MG PO CAPS Oral Take 1 capsule (100 mg total) by mouth 2 (two) times daily. 14 capsule 0  . ESOMEPRAZOLE MAGNESIUM 40 MG PO CPDR Oral Take 40 mg by mouth daily before breakfast.    . METHADONE HCL 10 MG PO TABS Oral Take 100 mg by mouth every 8 (eight) hours.    . MULTIVITAMINS PO CAPS Oral Take 1 capsule by mouth daily.    Marland Kitchen NITROFURANTOIN MONOHYD MACRO 100 MG PO CAPS Oral Take 100 mg by mouth 2 (two) times daily.    Marland Kitchen PREDNISONE 10 MG PO TABS Oral Take 4 tablets (40 mg total) by mouth daily. 20 tablet 0  . PRENATAL RX 60-1 MG PO TABS Oral Take 1 tablet by mouth daily. 30 tablet 0  . PROMETHAZINE HCL 25 MG PO TABS Oral Take 1 tablet (25 mg total) by mouth every 6 (six) hours as needed for nausea. 12 tablet 0    BP 119/81  Pulse 63  Temp 97.7 F (36.5 C) (Oral)  Resp 18  SpO2 98%  LMP 12/06/2011  Physical Exam  Nursing note and vitals reviewed. Constitutional: She is oriented to person, place, and time. She appears well-developed and well-nourished.  HENT:  Head: Normocephalic.  Mouth/Throat: Oropharynx is clear and moist.  Eyes: Conjunctivae normal and EOM are normal. Pupils are equal, round, and reactive to light.  Neck: Normal range of motion. Neck supple.  Cardiovascular: Normal rate, regular rhythm  and normal heart sounds.   No murmur heard. Pulmonary/Chest: Effort normal and breath sounds normal. No respiratory distress. She has no rales. She exhibits no tenderness.  Abdominal: Soft. There is no tenderness.  Musculoskeletal: Normal range of motion.  Neurological: She is alert and oriented to person, place, and time. No cranial nerve deficit. Coordination normal.  Skin: Skin is warm. No rash noted.    ED Course  Procedures (including critical care time)  Labs Reviewed - No data to display Dg Chest 2 View  12/06/2011  *RADIOLOGY REPORT*  Clinical Data: 26 year old female nasal congestion cough fever nausea vomiting diarrhea and headache.  CHEST  - 2 VIEW  Comparison: 05/12/2008.  Findings: Lung volumes are within normal limits.  Cardiac size and mediastinal contours are within normal limits.  Visualized tracheal air column is within normal limits.  Chronic increased interstitial markings, mildly increased with perihilar predominance.  No focal airspace disease.  No pleural effusion.  No pneumothorax. Pulmonary vascularity appears within normal limits. No acute osseous abnormality identified.  IMPRESSION: Acute-on-chronic increased interstitial markings, favor viral / atypical respiratory infection.   Original Report Authenticated By: Harley Hallmark, M.D.      1. Bronchitis       MDM  Trial of antibiotic in case an atypical pneumonia. Due to patient's allergy and methadone contraindication patient given doxycycline. Also we'll treat with prednisone and albuterol inhaler for bronchitis.        Shelda Jakes, MD 12/06/11 1255

## 2011-12-06 NOTE — ED Notes (Signed)
States that her whole family is currently  sick, but she has been sick for two weeks and it isn't getting better. Nasal congestion, cough, fever

## 2011-12-15 ENCOUNTER — Other Ambulatory Visit: Payer: Self-pay | Admitting: Internal Medicine

## 2012-11-03 ENCOUNTER — Encounter: Payer: Self-pay | Admitting: Neurology

## 2012-11-03 ENCOUNTER — Ambulatory Visit (INDEPENDENT_AMBULATORY_CARE_PROVIDER_SITE_OTHER): Payer: Medicaid Other | Admitting: Neurology

## 2012-11-03 VITALS — BP 129/77 | HR 79 | Ht 68.0 in | Wt 173.0 lb

## 2012-11-03 DIAGNOSIS — G44209 Tension-type headache, unspecified, not intractable: Secondary | ICD-10-CM

## 2012-11-03 DIAGNOSIS — R51 Headache: Secondary | ICD-10-CM

## 2012-11-03 DIAGNOSIS — G43019 Migraine without aura, intractable, without status migrainosus: Secondary | ICD-10-CM | POA: Insufficient documentation

## 2012-11-03 MED ORDER — TOPIRAMATE 50 MG PO TABS: 50.0000 mg | ORAL_TABLET | Freq: Two times a day (BID) | ORAL | Status: AC

## 2012-11-03 NOTE — Patient Instructions (Addendum)
Continue Relpax for symptomatic relief for headaches and start Topamax 50 mg daily for one week increase if tolerated twice daily for headache prophylaxis. Consider Botox for migraine prophylaxis after insurance approval. I advised her to do daily neck stretching exercises as well. Return for followup after Botox approval.

## 2012-11-03 NOTE — Progress Notes (Signed)
[facility] [address] [place]. [place] [address] [phone]       OFFICE FOLLOW-UP NOTE  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 She continues to have almost daily headaches which represent mixed migraine and tension headaches. She describes daily nausea as well as bitemporal and vertex headaches which are mild-to-moderate mostly but she does get severe headaches with light and sound sensitivity and nausea several times a week.   Relpax   was working quite well until recent months when she feels is not working as well. She's unable to identify specific triggers except stress. She does admit to significant tension in her neck muscles. She was on Inderal LA 60 mg which I prescribed and felt that that was helping somewhat but she ran out of per her prescription a few months ago and stopped it. She does not want to try Zanaflex on any muscle relaxants. She has not been on Protonix but is willing to consider it. She did undergo MRI scan of her brain on 02/13/12 which was normal. ROS:   14 system review of systems is positive for fatigue, snoring, constipation coordination problem, feeling hot, joint pain, aching muscles, depression, and anxiety, racing thoughts, decreased energy  PMH:  Past Medical History  Diagnosis Date  . Kidney infection   . Cervical cancer   . Post partum depression   . Kidney stones   . Headache(784.0)   . Depression   . Chronic UTI (urinary tract infection)     Social History:  History   Social History  . Marital Status: Single    Spouse Name: N/A    Number of Children: N/A  . Years of Education: N/A   Occupational History  . Not on file.   Social History Main Topics  . Smoking status: Current Every Day Smoker  . Smokeless tobacco:  Never Used  . Alcohol Use: No  . Drug Use: No  . Sexual Activity:    Other Topics Concern  . Not on file   Social History Narrative   Patient lives at home with her parents and son.    Patient smokes a half a pack a day and drinks coffee once a day.    Medications:   Current Outpatient Prescriptions on File Prior to Visit  Medication Sig Dispense Refill  . albuterol (PROVENTIL HFA;VENTOLIN HFA) 108 (90 BASE) MCG/ACT inhaler Inhale 1-2 puffs into the lungs every 6 (six) hours as needed for wheezing.  1 Inhaler  0  . methadone (DOLOPHINE) 10 MG tablet Take 99 mg by mouth every 8 (eight) hours.       . nitrofurantoin, macrocrystal-monohydrate, (MACROBID) 100 MG capsule Take 100 mg by mouth 2 (two) times daily.       No current facility-administered medications on file prior to visit.    Allergies:   Allergies  Allergen Reactions  . Aspirin Palpitations  . Ciprofloxacin Rash  . Sulfa Antibiotics Palpitations   Filed Vitals:   11/03/12 1531  BP: 129/77  Pulse: 79    Physical Exam General: well developed, well nourished, seated, in no evident distress Head: head normocephalic and atraumatic. Orohparynx benign Neck: supple with no carotid or supraclavicular bruits Cardiovascular: regular rate and rhythm, no murmurs Musculoskeletal: no deformity Skin:  no rash/petichiae Vascular:  Normal pulses  all extremities  Neurologic Exam Mental Status: Awake and fully alert. Oriented to place and time. Recent and remote memory intact. Attention span, concentration and fund of knowledge appropriate. Mood and affect appropriate.  Cranial Nerves: Fundoscopic exam reveals sharp disc margins. Pupils equal, briskly reactive to light. Extraocular movements full without nystagmus. Visual fields full to confrontation. Hearing intact. Facial sensation intact. Face, tongue, palate moves normally and symmetrically.  Motor: Normal bulk and tone. Normal strength in all tested extremity  muscles. Sensory.: intact to tough and pinprick and vibratory.  Coordination: Rapid alternating movements normal in all extremities. Finger-to-nose and heel-to-shin performed accurately bilaterally. Gait and Station: Arises from chair without difficulty. Stance is normal. Gait demonstrates normal stride length and balance . Able to heel, toe and tandem walk without difficulty.  Reflexes: 1+ and symmetric. Toes downgoing.     ASSESSMENT: 6 year Caucasian lady with chronic daily mixed migraine and tension headaches    PLAN: Continue Relpax for symptomatic relief for headaches and start Topamax 50 mg daily for one week increase if tolerated twice daily for headache prophylaxis. Consider Botox for migraine prophylaxis after insurance approval. I advised her to do daily neck stretching exercises as well. Return for followup after Botox approval.

## 2012-11-17 ENCOUNTER — Emergency Department (HOSPITAL_BASED_OUTPATIENT_CLINIC_OR_DEPARTMENT_OTHER)
Admission: EM | Admit: 2012-11-17 | Discharge: 2012-11-17 | Disposition: A | Discharge status: Home / Self Care | Payer: Medicaid Other | Attending: Emergency Medicine | Admitting: Emergency Medicine

## 2012-11-17 ENCOUNTER — Encounter (HOSPITAL_BASED_OUTPATIENT_CLINIC_OR_DEPARTMENT_OTHER): Payer: Self-pay | Admitting: Family Medicine

## 2012-11-17 DIAGNOSIS — H9209 Otalgia, unspecified ear: Secondary | ICD-10-CM | POA: Insufficient documentation

## 2012-11-17 DIAGNOSIS — F329 Major depressive disorder, single episode, unspecified: Secondary | ICD-10-CM | POA: Insufficient documentation

## 2012-11-17 DIAGNOSIS — R5381 Other malaise: Secondary | ICD-10-CM | POA: Insufficient documentation

## 2012-11-17 DIAGNOSIS — F17200 Nicotine dependence, unspecified, uncomplicated: Secondary | ICD-10-CM | POA: Insufficient documentation

## 2012-11-17 DIAGNOSIS — J029 Acute pharyngitis, unspecified: Secondary | ICD-10-CM | POA: Insufficient documentation

## 2012-11-17 DIAGNOSIS — Z79899 Other long term (current) drug therapy: Secondary | ICD-10-CM | POA: Insufficient documentation

## 2012-11-17 DIAGNOSIS — Z87442 Personal history of urinary calculi: Secondary | ICD-10-CM | POA: Insufficient documentation

## 2012-11-17 DIAGNOSIS — J40 Bronchitis, not specified as acute or chronic: Secondary | ICD-10-CM

## 2012-11-17 DIAGNOSIS — Z8541 Personal history of malignant neoplasm of cervix uteri: Secondary | ICD-10-CM | POA: Insufficient documentation

## 2012-11-17 DIAGNOSIS — Z87440 Personal history of urinary (tract) infections: Secondary | ICD-10-CM | POA: Insufficient documentation

## 2012-11-17 DIAGNOSIS — F3289 Other specified depressive episodes: Secondary | ICD-10-CM | POA: Insufficient documentation

## 2012-11-17 DIAGNOSIS — R059 Cough, unspecified: Secondary | ICD-10-CM | POA: Insufficient documentation

## 2012-11-17 DIAGNOSIS — R05 Cough: Secondary | ICD-10-CM | POA: Insufficient documentation

## 2012-11-17 DIAGNOSIS — Z9889 Other specified postprocedural states: Secondary | ICD-10-CM | POA: Insufficient documentation

## 2012-11-17 DIAGNOSIS — R062 Wheezing: Secondary | ICD-10-CM | POA: Insufficient documentation

## 2012-11-17 DIAGNOSIS — IMO0001 Reserved for inherently not codable concepts without codable children: Secondary | ICD-10-CM | POA: Insufficient documentation

## 2012-11-17 MED ORDER — AZITHROMYCIN 250 MG PO TABS: 250.0000 mg | ORAL_TABLET | Freq: Every day | ORAL | Status: AC

## 2012-11-17 MED ORDER — ALBUTEROL SULFATE HFA 108 (90 BASE) MCG/ACT IN AERS
1.0000 | INHALATION_SPRAY | RESPIRATORY_TRACT | Status: DC | PRN
  Administered 2012-11-17: 2 via RESPIRATORY_TRACT
  Filled 2012-11-17: qty 6.7

## 2012-11-17 NOTE — ED Provider Notes (Addendum)
CSN: [id number]     Arrival date & time 11/17/12  [id number] History   First MD Initiated Contact with Patient 11/17/12 [phone]     Chief Complaint  Patient presents with  . Nasal Congestion  . Otalgia   (Consider location/radiation/quality/duration/timing/severity/associated sxs/prior Treatment) Patient is a 27 y.o. female presenting with URI. The history is provided by the patient.  URI Presenting symptoms: congestion, cough, ear pain, fatigue, rhinorrhea and sore throat   Cough:    Cough characteristics:  Productive   Sputum characteristics:  Green   Severity:  Moderate   Timing:  Constant   Progression:  Worsening Severity:  Moderate Onset quality:  Gradual Duration:  1 week Timing:  Constant Progression:  Worsening Chronicity:  New Relieved by:  Nothing Worsened by:  Breathing Ineffective treatments:  OTC medications Associated symptoms: myalgias and wheezing   Risk factors comment:  Smoker   Past Medical History  Diagnosis Date  . Kidney infection   . Cervical cancer   . Post partum depression   . Kidney stones   . Headache(784.0)   . Depression   . Chronic UTI (urinary tract infection)    Past Surgical History  Procedure Laterality Date  . Cervix surgery    . Bladder surgery     No family history on file. History  Substance Use Topics  . Smoking status: Current Every Day Smoker  . Smokeless tobacco: Never Used  . Alcohol Use: No   OB History   Grav Para Term Preterm Abortions TAB SAB Ect Mult Living                 Review of Systems  Constitutional: Positive for fatigue.  HENT: Positive for ear pain, congestion, sore throat and rhinorrhea.   Respiratory: Positive for cough and wheezing.   Musculoskeletal: Positive for myalgias.  All other systems reviewed and are negative.    Allergies  Aspirin; Ciprofloxacin; and Sulfa antibiotics  Home Medications   Current Outpatient Rx  Name  Route  Sig  Dispense  Refill  . albuterol (PROVENTIL HFA;VENTOLIN  HFA) 108 (90 BASE) MCG/ACT inhaler   Inhalation   Inhale 1-2 puffs into the lungs every 6 (six) hours as needed for wheezing.   1 Inhaler   0   . eletriptan (RELPAX) 40 MG tablet   Oral   Take 40 mg by mouth as needed for migraine. One tablet by mouth at onset of headache. May repeat in 2 hours if headache persists or recurs.         . methadone (DOLOPHINE) 10 MG tablet   Oral   Take 99 mg by mouth every 8 (eight) hours.          . Multiple Vitamins-Minerals (CENTRUM PO)   Oral   Take 1 tablet by mouth daily.         . nitrofurantoin, macrocrystal-monohydrate, (MACROBID) 100 MG capsule   Oral   Take 100 mg by mouth 2 (two) times daily.         Marland Kitchen omeprazole (PRILOSEC) 40 MG capsule   Oral   Take 40 mg by mouth daily.         . Probiotic Product (PROBIOTIC DAILY) CAPS   Oral   Take 1 capsule by mouth daily.         . propranolol ER (INDERAL LA) 60 MG 24 hr capsule   Oral   Take 60 mg by mouth daily.         Marland Kitchen topiramate (TOPAMAX)  50 MG tablet   Oral   Take 1 tablet (50 mg total) by mouth 2 (two) times daily. Take one tablet daily x 1 week then twice daily   60 tablet   1    LMP 10/20/2012 Physical Exam  Nursing note and vitals reviewed. Constitutional: She is oriented to person, place, and time. She appears well-developed and well-nourished. No distress.  HENT:  Head: Normocephalic and atraumatic.  Right Ear: Tympanic membrane and ear canal normal.  Left Ear: Tympanic membrane and ear canal normal.  Nose: Mucosal edema and rhinorrhea present.  Mouth/Throat: Posterior oropharyngeal erythema present. No oropharyngeal exudate, posterior oropharyngeal edema or tonsillar abscesses.  Eyes: Conjunctivae and EOM are normal. Pupils are equal, round, and reactive to light.  Neck: Normal range of motion. Neck supple.  Cardiovascular: Normal rate, regular rhythm and intact distal pulses.   No murmur heard. Pulmonary/Chest: Effort normal and breath sounds normal.  No respiratory distress. She has no wheezes. She has no rales.  Abdominal: Soft. She exhibits no distension. There is no tenderness. There is no rebound and no guarding.  Musculoskeletal: Normal range of motion. She exhibits no edema and no tenderness.  Lymphadenopathy:    She has no cervical adenopathy.  Neurological: She is alert and oriented to person, place, and time.  Skin: Skin is warm and dry. No rash noted. No erythema.  Psychiatric: She has a normal mood and affect. Her behavior is normal.    ED Course  Procedures (including critical care time) Labs Review Labs Reviewed - No data to display Imaging Review No results found.  MDM   1. Bronchitis     Pt with symptoms consistent with viral URI/bronchitis.  Well appearing here.  No signs of breathing difficulty but notes wheezing at home.  No signs of pharyngitis, otitis or abnormal abdominal findings.   Lungs clear so at this time low suspicion for CAP.  However due to smoker worsening sx for 1 week will treat with albuterol/azithromycin.   Gwyneth Sprout, MD 11/17/12 0930  Gwyneth Sprout, MD 11/17/12 [phone]

## 2012-11-17 NOTE — ED Notes (Signed)
Pt c/o nasal congestion, otalgia, sore throat x 1 wk.

## 2013-05-02 ENCOUNTER — Emergency Department (HOSPITAL_BASED_OUTPATIENT_CLINIC_OR_DEPARTMENT_OTHER)
Admission: EM | Admit: 2013-05-02 | Discharge: 2013-05-02 | Disposition: A | Discharge status: Home / Self Care | Payer: Medicaid Other | Attending: Emergency Medicine | Admitting: Emergency Medicine

## 2013-05-02 ENCOUNTER — Encounter (HOSPITAL_BASED_OUTPATIENT_CLINIC_OR_DEPARTMENT_OTHER): Payer: Self-pay | Admitting: Emergency Medicine

## 2013-05-02 DIAGNOSIS — F329 Major depressive disorder, single episode, unspecified: Secondary | ICD-10-CM | POA: Insufficient documentation

## 2013-05-02 DIAGNOSIS — Z792 Long term (current) use of antibiotics: Secondary | ICD-10-CM | POA: Insufficient documentation

## 2013-05-02 DIAGNOSIS — Z9889 Other specified postprocedural states: Secondary | ICD-10-CM | POA: Insufficient documentation

## 2013-05-02 DIAGNOSIS — F3289 Other specified depressive episodes: Secondary | ICD-10-CM | POA: Insufficient documentation

## 2013-05-02 DIAGNOSIS — Z87442 Personal history of urinary calculi: Secondary | ICD-10-CM | POA: Insufficient documentation

## 2013-05-02 DIAGNOSIS — Z3202 Encounter for pregnancy test, result negative: Secondary | ICD-10-CM | POA: Insufficient documentation

## 2013-05-02 DIAGNOSIS — Z8541 Personal history of malignant neoplasm of cervix uteri: Secondary | ICD-10-CM | POA: Insufficient documentation

## 2013-05-02 DIAGNOSIS — N390 Urinary tract infection, site not specified: Secondary | ICD-10-CM

## 2013-05-02 DIAGNOSIS — F17200 Nicotine dependence, unspecified, uncomplicated: Secondary | ICD-10-CM | POA: Insufficient documentation

## 2013-05-02 DIAGNOSIS — Z79899 Other long term (current) drug therapy: Secondary | ICD-10-CM | POA: Insufficient documentation

## 2013-05-02 LAB — URINALYSIS, ROUTINE W REFLEX MICROSCOPIC
Glucose, UA: NEGATIVE mg/dL
HGB URINE DIPSTICK: NEGATIVE
Ketones, ur: 15 mg/dL — AB
NITRITE: POSITIVE — AB
Protein, ur: 30 mg/dL — AB
SPECIFIC GRAVITY, URINE: 1.029 (ref 1.005–1.030)
UROBILINOGEN UA: 2 mg/dL — AB (ref 0.0–1.0)
pH: 5.5 (ref 5.0–8.0)

## 2013-05-02 LAB — URINE MICROSCOPIC-ADD ON

## 2013-05-02 LAB — PREGNANCY, URINE: PREG TEST UR: NEGATIVE

## 2013-05-02 MED ORDER — CEPHALEXIN 500 MG PO CAPS: 500.0000 mg | ORAL_CAPSULE | Freq: Two times a day (BID) | ORAL | Status: DC

## 2013-05-02 NOTE — ED Notes (Signed)
Seen at [facility] 4 days ago diagnosed with UTI and BV given cipro 500 mg to take bid and flagyl has taken all meds but is still having low back pain and burning with urination.

## 2013-05-02 NOTE — ED Notes (Signed)
Patient asked to change into gown & provide clean catch urine sample.

## 2013-05-02 NOTE — ED Provider Notes (Signed)
CSN: [id number]     Arrival date & time 05/02/13  [id number] History   First MD Initiated Contact with Patient 05/02/13 [phone]     Chief Complaint  Patient presents with  . UTI not resolving after 4 days treatment    (Consider location/radiation/quality/duration/timing/severity/associated sxs/prior Treatment) HPI Comments: Patient presents with symptoms of UTI. She states that she's had frequent UTIs and over the last week or so has had her typical UTI symptoms. She's had burning when she pees and frequent urination. She has discomfort to her suprapubic area and she's had worsening pain across her low back. She denies any nausea or vomiting. She denies any fevers or chills. She initially had some vaginal discharge with an odor but was recently tested for bacterial vaginosis and started on Flagyl and she feels that the symptoms have gone away. She also has had history of kidney stones however today she says her pain is across both sides and not unilateral. She also denies any obvious hematuria. She was seen at [facility] 4 days ago and diagnosed with the UTI and bacterial vaginosis and started on Cipro and Flagyl. She states her vaginal discharge has gone away but her urinary symptoms have continued. She states she was tested for STDs at [facility].   Past Medical History  Diagnosis Date  . Kidney infection   . Cervical cancer   . Post partum depression   . Kidney stones   . Headache(784.0)   . Depression   . Chronic UTI (urinary tract infection)    Past Surgical History  Procedure Laterality Date  . Cervix surgery    . Bladder surgery     History reviewed. No pertinent family history. History  Substance Use Topics  . Smoking status: Current Every Day Smoker  . Smokeless tobacco: Never Used  . Alcohol Use: No   OB History   Grav Para Term Preterm Abortions TAB SAB Ect Mult Living                 Review of Systems  Constitutional: Negative for fever, chills,  diaphoresis and fatigue.  HENT: Negative for congestion, rhinorrhea and sneezing.   Eyes: Negative.   Respiratory: Negative for cough, chest tightness and shortness of breath.   Cardiovascular: Negative for chest pain and leg swelling.  Gastrointestinal: Negative for nausea, vomiting, abdominal pain, diarrhea and blood in stool.  Genitourinary: Positive for dysuria, urgency and frequency. Negative for hematuria, flank pain, vaginal bleeding, vaginal discharge and difficulty urinating.  Musculoskeletal: Positive for back pain. Negative for arthralgias.  Skin: Negative for rash.  Neurological: Negative for dizziness, speech difficulty, weakness, numbness and headaches.    Allergies  Aspirin and Sulfa antibiotics  Home Medications   Current Outpatient Rx  Name  Route  Sig  Dispense  Refill  . EXPIRED: albuterol (PROVENTIL HFA;VENTOLIN HFA) 108 (90 BASE) MCG/ACT inhaler   Inhalation   Inhale 1-2 puffs into the lungs every 6 (six) hours as needed for wheezing.   1 Inhaler   0   . azithromycin (ZITHROMAX) 250 MG tablet   Oral   Take 1 tablet (250 mg total) by mouth daily. Take first 2 tablets together, then 1 every day until finished.   6 tablet   0   . cephALEXin (KEFLEX) 500 MG capsule   Oral   Take 1 capsule (500 mg total) by mouth 2 (two) times daily.   14 capsule   0   . eletriptan (RELPAX) 40  MG tablet   Oral   Take 40 mg by mouth as needed for migraine. One tablet by mouth at onset of headache. May repeat in 2 hours if headache persists or recurs.         . methadone (DOLOPHINE) 10 MG tablet   Oral   Take 99 mg by mouth every 8 (eight) hours.          . Multiple Vitamins-Minerals (CENTRUM PO)   Oral   Take 1 tablet by mouth daily.         . nitrofurantoin, macrocrystal-monohydrate, (MACROBID) 100 MG capsule   Oral   Take 100 mg by mouth 2 (two) times daily.         Marland Kitchen omeprazole (PRILOSEC) 40 MG capsule   Oral   Take 40 mg by mouth daily.         .  Probiotic Product (PROBIOTIC DAILY) CAPS   Oral   Take 1 capsule by mouth daily.         . propranolol ER (INDERAL LA) 60 MG 24 hr capsule   Oral   Take 60 mg by mouth daily.         Marland Kitchen topiramate (TOPAMAX) 50 MG tablet   Oral   Take 1 tablet (50 mg total) by mouth 2 (two) times daily. Take one tablet daily x 1 week then twice daily   60 tablet   1    BP 150/86  Pulse 68  Temp(Src) 98.4 F (36.9 C) (Oral)  Resp 16  Ht 5\' 8"  (1.727 m)  Wt 185 lb (83.915 kg)  BMI 28.14 kg/m2  SpO2 100% Physical Exam  Constitutional: She is oriented to person, place, and time. She appears well-developed and well-nourished.  HENT:  Head: Normocephalic and atraumatic.  Eyes: Pupils are equal, round, and reactive to light.  Neck: Normal range of motion. Neck supple.  Cardiovascular: Normal rate, regular rhythm and normal heart sounds.   Pulmonary/Chest: Effort normal and breath sounds normal. No respiratory distress. She has no wheezes. She has no rales. She exhibits no tenderness.  Abdominal: Soft. Bowel sounds are normal. There is tenderness (mild suprapubic tenderness. There is CVA tenderness bilaterally). There is no rebound and no guarding.  Musculoskeletal: Normal range of motion. She exhibits no edema.  Lymphadenopathy:    She has no cervical adenopathy.  Neurological: She is alert and oriented to person, place, and time.  Skin: Skin is warm and dry. No rash noted.  Psychiatric: She has a normal mood and affect.    ED Course  Procedures (including critical care time) Labs Review Labs Reviewed  URINALYSIS, ROUTINE W REFLEX MICROSCOPIC - Abnormal; Notable for the following:    Color, Urine RED (*)    Bilirubin Urine SMALL (*)    Ketones, ur 15 (*)    Protein, ur 30 (*)    Urobilinogen, UA 2.0 (*)    Nitrite POSITIVE (*)    Leukocytes, UA SMALL (*)    All other components within normal limits  URINE MICROSCOPIC-ADD ON - Abnormal; Notable for the following:    Squamous  Epithelial / LPF FEW (*)    Bacteria, UA MANY (*)    All other components within normal limits  URINE CULTURE  PREGNANCY, URINE   Imaging Review No results found.  EKG Interpretation   None       MDM   1. UTI (lower urinary tract infection)    Patient presents with UTI symptoms. Her urine is consistent with  a urinary tract infection. Her symptoms are not consistent with renal colic. We will go ahead and switch her to Keflex pending urine culture.    Malvin Johns, MD 05/02/13 [phone]

## 2013-05-02 NOTE — Discharge Instructions (Signed)
Urinary Tract Infection  Urinary tract infections (UTIs) can develop anywhere along your urinary tract. Your urinary tract is your body's drainage system for removing wastes and extra water. Your urinary tract includes two kidneys, two ureters, a bladder, and a urethra. Your kidneys are a pair of bean-shaped organs. Each kidney is about the size of your fist. They are located below your ribs, one on each side of your spine.  CAUSES  Infections are caused by microbes, which are microscopic organisms, including fungi, viruses, and bacteria. These organisms are so small that they can only be seen through a microscope. Bacteria are the microbes that most commonly cause UTIs.  SYMPTOMS   Symptoms of UTIs may vary by age and gender of the patient and by the location of the infection. Symptoms in young women typically include a frequent and intense urge to urinate and a painful, burning feeling in the bladder or urethra during urination. Older women and men are more likely to be tired, shaky, and weak and have muscle aches and abdominal pain. A fever may mean the infection is in your kidneys. Other symptoms of a kidney infection include pain in your back or sides below the ribs, nausea, and vomiting.  DIAGNOSIS  To diagnose a UTI, your caregiver will ask you about your symptoms. Your caregiver also will ask to provide a urine sample. The urine sample will be tested for bacteria and white blood cells. White blood cells are made by your body to help fight infection.  TREATMENT   Typically, UTIs can be treated with medication. Because most UTIs are caused by a bacterial infection, they usually can be treated with the use of antibiotics. The choice of antibiotic and length of treatment depend on your symptoms and the type of bacteria causing your infection.  HOME CARE INSTRUCTIONS   If you were prescribed antibiotics, take them exactly as your caregiver instructs you. Finish the medication even if you feel better after you  have only taken some of the medication.   Drink enough water and fluids to keep your urine clear or pale yellow.   Avoid caffeine, tea, and carbonated beverages. They tend to irritate your bladder.   Empty your bladder often. Avoid holding urine for long periods of time.   Empty your bladder before and after sexual intercourse.   After a bowel movement, women should cleanse from front to back. Use each tissue only once.  SEEK MEDICAL CARE IF:    You have back pain.   You develop a fever.   Your symptoms do not begin to resolve within 3 days.  SEEK IMMEDIATE MEDICAL CARE IF:    You have severe back pain or lower abdominal pain.   You develop chills.   You have nausea or vomiting.   You have continued burning or discomfort with urination.  MAKE SURE YOU:    Understand these instructions.   Will watch your condition.   Will get help right away if you are not doing well or get worse.  Document Released: 12/18/2004 Document Revised: 09/09/2011 Document Reviewed: 04/18/2011  [employer] Patient Information 2014 [employer]

## 2013-05-02 NOTE — ED Notes (Signed)
RN at bedside for triage.

## 2013-05-03 LAB — URINE CULTURE
Colony Count: NO GROWTH
Culture: NO GROWTH
SPECIAL REQUESTS: NORMAL

## 2013-11-12 ENCOUNTER — Encounter (HOSPITAL_BASED_OUTPATIENT_CLINIC_OR_DEPARTMENT_OTHER): Payer: Self-pay | Admitting: Emergency Medicine

## 2013-11-12 ENCOUNTER — Emergency Department (HOSPITAL_BASED_OUTPATIENT_CLINIC_OR_DEPARTMENT_OTHER)
Admission: EM | Admit: 2013-11-12 | Discharge: 2013-11-12 | Disposition: A | Discharge status: Home / Self Care | Payer: Medicaid Other | Attending: Emergency Medicine | Admitting: Emergency Medicine

## 2013-11-12 DIAGNOSIS — Z9889 Other specified postprocedural states: Secondary | ICD-10-CM | POA: Diagnosis not present

## 2013-11-12 DIAGNOSIS — Z8541 Personal history of malignant neoplasm of cervix uteri: Secondary | ICD-10-CM | POA: Diagnosis not present

## 2013-11-12 DIAGNOSIS — F329 Major depressive disorder, single episode, unspecified: Secondary | ICD-10-CM | POA: Diagnosis not present

## 2013-11-12 DIAGNOSIS — Z79899 Other long term (current) drug therapy: Secondary | ICD-10-CM | POA: Diagnosis not present

## 2013-11-12 DIAGNOSIS — Z3202 Encounter for pregnancy test, result negative: Secondary | ICD-10-CM | POA: Diagnosis not present

## 2013-11-12 DIAGNOSIS — Z792 Long term (current) use of antibiotics: Secondary | ICD-10-CM | POA: Insufficient documentation

## 2013-11-12 DIAGNOSIS — F3289 Other specified depressive episodes: Secondary | ICD-10-CM | POA: Diagnosis not present

## 2013-11-12 DIAGNOSIS — F17200 Nicotine dependence, unspecified, uncomplicated: Secondary | ICD-10-CM | POA: Diagnosis not present

## 2013-11-12 DIAGNOSIS — R350 Frequency of micturition: Secondary | ICD-10-CM | POA: Diagnosis present

## 2013-11-12 DIAGNOSIS — Z87442 Personal history of urinary calculi: Secondary | ICD-10-CM | POA: Insufficient documentation

## 2013-11-12 DIAGNOSIS — N390 Urinary tract infection, site not specified: Secondary | ICD-10-CM | POA: Diagnosis not present

## 2013-11-12 LAB — URINALYSIS, ROUTINE W REFLEX MICROSCOPIC
Bilirubin Urine: NEGATIVE
Glucose, UA: NEGATIVE mg/dL
Hgb urine dipstick: NEGATIVE
Ketones, ur: NEGATIVE mg/dL
NITRITE: POSITIVE — AB
Protein, ur: NEGATIVE mg/dL
SPECIFIC GRAVITY, URINE: 1.023 (ref 1.005–1.030)
UROBILINOGEN UA: 1 mg/dL (ref 0.0–1.0)
pH: 6 (ref 5.0–8.0)

## 2013-11-12 LAB — URINE MICROSCOPIC-ADD ON

## 2013-11-12 LAB — PREGNANCY, URINE: PREG TEST UR: NEGATIVE

## 2013-11-12 MED ORDER — ESOMEPRAZOLE MAGNESIUM 40 MG PO CPDR: 40.0000 mg | DELAYED_RELEASE_CAPSULE | Freq: Every day | ORAL | Status: AC

## 2013-11-12 MED ORDER — NITROFURANTOIN MONOHYD MACRO 100 MG PO CAPS: 100.0000 mg | ORAL_CAPSULE | Freq: Two times a day (BID) | ORAL | Status: AC

## 2013-11-12 MED ORDER — CEPHALEXIN 500 MG PO CAPS: 500.0000 mg | ORAL_CAPSULE | Freq: Two times a day (BID) | ORAL | Status: DC

## 2013-11-12 NOTE — Discharge Instructions (Signed)

## 2013-11-12 NOTE — ED Provider Notes (Signed)
CSN: [id number]     Arrival date & time 11/12/13  2353 History   First MD Initiated Contact with Patient 11/12/13 [phone]     No chief complaint on file.    (Consider location/radiation/quality/duration/timing/severity/associated sxs/prior Treatment) HPI 28 year old female presents with burning with urination over the last couple days. She states this feels like her prior UTIs. She's been having frequent UTIs since she was 32 months old. She's also been having urinary frequency. She's been trying ibuprofen and Pyridium without relief. She did having nausea and low back pain which is usually her sign that this is a UTI. She states she also gets nauseous when taking Pyridium so she's not sure is from that. She's been told she sometimes has interstitial cystitis and is wondering if it is this or some other she's dehydrated feel similarly. Denies any fevers. Denies vaginal discharge or concerns for STD. States she's never had one before and this feels like her multiple prior UTIs.  Past Medical History  Diagnosis Date  . Kidney infection   . Cervical cancer   . Post partum depression   . Kidney stones   . Headache(784.0)   . Depression   . Chronic UTI (urinary tract infection)    Past Surgical History  Procedure Laterality Date  . Cervix surgery    . Bladder surgery     No family history on file. History  Substance Use Topics  . Smoking status: Current Every Day Smoker  . Smokeless tobacco: Never Used  . Alcohol Use: No   OB History   Grav Para Term Preterm Abortions TAB SAB Ect Mult Living                 Review of Systems  Constitutional: Negative for fever.  Gastrointestinal: Positive for nausea and abdominal pain. Negative for vomiting.  Genitourinary: Positive for dysuria and frequency. Negative for vaginal bleeding and vaginal discharge.  Musculoskeletal: Positive for back pain.  All other systems reviewed and are negative.     Allergies  Aspirin and Sulfa  antibiotics  Home Medications   Prior to Admission medications   Medication Sig Start Date End Date Taking? Authorizing Provider  albuterol (PROVENTIL HFA;VENTOLIN HFA) 108 (90 BASE) MCG/ACT inhaler Inhale 1-2 puffs into the lungs every 6 (six) hours as needed for wheezing. 12/06/11 12/05/12  Fredia Sorrow, MD  azithromycin (ZITHROMAX) 250 MG tablet Take 1 tablet (250 mg total) by mouth daily. Take first 2 tablets together, then 1 every day until finished. 11/17/12   Blanchie Dessert, MD  cephALEXin (KEFLEX) 500 MG capsule Take 1 capsule (500 mg total) by mouth 2 (two) times daily. 05/02/13   Malvin Johns, MD  eletriptan (RELPAX) 40 MG tablet Take 40 mg by mouth as needed for migraine. One tablet by mouth at onset of headache. May repeat in 2 hours if headache persists or recurs.    Historical Provider, MD  methadone (DOLOPHINE) 10 MG tablet Take 99 mg by mouth every 8 (eight) hours.     Historical Provider, MD  Multiple Vitamins-Minerals (CENTRUM PO) Take 1 tablet by mouth daily.    Historical Provider, MD  nitrofurantoin, macrocrystal-monohydrate, (MACROBID) 100 MG capsule Take 100 mg by mouth 2 (two) times daily. 04/14/11   Historical Provider, MD  omeprazole (PRILOSEC) 40 MG capsule Take 40 mg by mouth daily.    Historical Provider, MD  Probiotic Product (PROBIOTIC DAILY) CAPS Take 1 capsule by mouth daily.    Historical Provider, MD  propranolol ER (INDERAL LA) 60  MG 24 hr capsule Take 60 mg by mouth daily.    Historical Provider, MD  topiramate (TOPAMAX) 50 MG tablet Take 1 tablet (50 mg total) by mouth 2 (two) times daily. Take one tablet daily x 1 week then twice daily 11/03/12   Antony Contras, MD   BP 128/77  Pulse 84  Temp(Src) 97.9 F (36.6 C) (Oral)  Resp 18  Ht 5\' 8"  (1.727 m)  Wt 200 lb (90.719 kg)  BMI 30.42 kg/m2  SpO2 98% Physical Exam  Nursing note and vitals reviewed. Constitutional: She is oriented to person, place, and time. She appears well-developed and well-nourished.  No distress.  HENT:  Head: Normocephalic and atraumatic.  Right Ear: External ear normal.  Left Ear: External ear normal.  Nose: Nose normal.  Eyes: Right eye exhibits no discharge. Left eye exhibits no discharge.  Cardiovascular: Normal rate, regular rhythm and normal heart sounds.   Pulmonary/Chest: Effort normal and breath sounds normal.  Abdominal: Soft. She exhibits no distension. There is no tenderness. There is no CVA tenderness.  Neurological: She is alert and oriented to person, place, and time.  Skin: Skin is warm and dry.    ED Course  Procedures (including critical care time) Labs Review Labs Reviewed  URINALYSIS, ROUTINE W REFLEX MICROSCOPIC - Abnormal; Notable for the following:    Color, Urine ORANGE (*)    APPearance CLOUDY (*)    Nitrite POSITIVE (*)    Leukocytes, UA LARGE (*)    All other components within normal limits  URINE MICROSCOPIC-ADD ON - Abnormal; Notable for the following:    Bacteria, UA MANY (*)    All other components within normal limits  URINE CULTURE  PREGNANCY, URINE    Imaging Review No results found.   EKG Interpretation None      MDM   Final diagnoses:  UTI (lower urinary tract infection)    Patient with 2 days of UTI-like symptoms. Appears to have an uncomplicated UTI she has normal heart rate, no fevers, and no vomiting. No CVA tenderness. She's requesting Macrobid or Levaquin given these had the most success with her. Will DC with Macrobid. She's also requesting a six-day prescription of Nexium until she can get in to see her doctor for her chronic heartburn.    Ephraim Hamburger, MD 11/12/13 [phone]

## 2013-11-12 NOTE — ED Notes (Signed)
Patient c/o painful urination for the past two days, Hx of UTI

## 2013-11-14 LAB — URINE CULTURE: Colony Count: 9000

## 2014-03-18 ENCOUNTER — Emergency Department (HOSPITAL_BASED_OUTPATIENT_CLINIC_OR_DEPARTMENT_OTHER)
Admission: EM | Admit: 2014-03-18 | Discharge: 2014-03-19 | Disposition: A | Discharge status: Home / Self Care | Payer: Medicaid Other | Attending: Emergency Medicine | Admitting: Emergency Medicine

## 2014-03-18 ENCOUNTER — Encounter (HOSPITAL_BASED_OUTPATIENT_CLINIC_OR_DEPARTMENT_OTHER): Payer: Self-pay | Admitting: Emergency Medicine

## 2014-03-18 DIAGNOSIS — F329 Major depressive disorder, single episode, unspecified: Secondary | ICD-10-CM | POA: Insufficient documentation

## 2014-03-18 DIAGNOSIS — Z720 Tobacco use: Secondary | ICD-10-CM | POA: Diagnosis not present

## 2014-03-18 DIAGNOSIS — N390 Urinary tract infection, site not specified: Secondary | ICD-10-CM | POA: Insufficient documentation

## 2014-03-18 DIAGNOSIS — Z79899 Other long term (current) drug therapy: Secondary | ICD-10-CM | POA: Insufficient documentation

## 2014-03-18 DIAGNOSIS — Z8541 Personal history of malignant neoplasm of cervix uteri: Secondary | ICD-10-CM | POA: Insufficient documentation

## 2014-03-18 DIAGNOSIS — M545 Low back pain: Secondary | ICD-10-CM | POA: Diagnosis present

## 2014-03-18 DIAGNOSIS — Z792 Long term (current) use of antibiotics: Secondary | ICD-10-CM | POA: Insufficient documentation

## 2014-03-18 DIAGNOSIS — A499 Bacterial infection, unspecified: Secondary | ICD-10-CM

## 2014-03-18 DIAGNOSIS — Z9089 Acquired absence of other organs: Secondary | ICD-10-CM | POA: Insufficient documentation

## 2014-03-18 LAB — URINE MICROSCOPIC-ADD ON

## 2014-03-18 LAB — URINALYSIS, ROUTINE W REFLEX MICROSCOPIC
Glucose, UA: NEGATIVE mg/dL
Ketones, ur: 15 mg/dL — AB
Nitrite: POSITIVE — AB
PROTEIN: 100 mg/dL — AB
Specific Gravity, Urine: 1.023 (ref 1.005–1.030)
UROBILINOGEN UA: 2 mg/dL — AB (ref 0.0–1.0)
pH: 5 (ref 5.0–8.0)

## 2014-03-18 MED ORDER — LEVOFLOXACIN IN D5W 750 MG/150ML IV SOLN
750.0000 mg | Freq: Once | INTRAVENOUS | Status: AC
  Administered 2014-03-18: 750 mg via INTRAVENOUS
  Filled 2014-03-18: qty 150

## 2014-03-18 MED ORDER — LEVOFLOXACIN 750 MG PO TABS: 750.0000 mg | ORAL_TABLET | Freq: Every day | ORAL | Status: AC

## 2014-03-18 NOTE — ED Notes (Signed)
Pt presents to ED with complaints of lower back pain and bilateral flank pain. PT states she has had UTI's in the past but not this bad. Pt states she was unable to wear jeans the pain was so bad. PT reports burning, frequency, and pain with urination. Pt took zofran today.

## 2014-03-18 NOTE — ED Notes (Signed)
Pt observed by resp tech taking medication from home and was approached by this Probation officer. Kimberly Mcgrath was asked not to take medications from home w/o speaking with their ED nurse or doctor first d/t medication safety reasons. She verbalized understanding

## 2014-03-18 NOTE — Discharge Instructions (Signed)
Levaquin as prescribed.  Return to the emergency department if you develop high fever, worsening pain, or other new and concerning symptoms.   Urinary Tract Infection Urinary tract infections (UTIs) can develop anywhere along your urinary tract. Your urinary tract is your body's drainage system for removing wastes and extra water. Your urinary tract includes two kidneys, two ureters, a bladder, and a urethra. Your kidneys are a pair of bean-shaped organs. Each kidney is about the size of your fist. They are located below your ribs, one on each side of your spine. CAUSES Infections are caused by microbes, which are microscopic organisms, including fungi, viruses, and bacteria. These organisms are so small that they can only be seen through a microscope. Bacteria are the microbes that most commonly cause UTIs. SYMPTOMS  Symptoms of UTIs may vary by age and gender of the patient and by the location of the infection. Symptoms in young women typically include a frequent and intense urge to urinate and a painful, burning feeling in the bladder or urethra during urination. Older women and men are more likely to be tired, shaky, and weak and have muscle aches and abdominal pain. A fever may mean the infection is in your kidneys. Other symptoms of a kidney infection include pain in your back or sides below the ribs, nausea, and vomiting. DIAGNOSIS To diagnose a UTI, your caregiver will ask you about your symptoms. Your caregiver also will ask to provide a urine sample. The urine sample will be tested for bacteria and white blood cells. White blood cells are made by your body to help fight infection. TREATMENT  Typically, UTIs can be treated with medication. Because most UTIs are caused by a bacterial infection, they usually can be treated with the use of antibiotics. The choice of antibiotic and length of treatment depend on your symptoms and the type of bacteria causing your infection. HOME CARE  INSTRUCTIONS  If you were prescribed antibiotics, take them exactly as your caregiver instructs you. Finish the medication even if you feel better after you have only taken some of the medication.  Drink enough water and fluids to keep your urine clear or pale yellow.  Avoid caffeine, tea, and carbonated beverages. They tend to irritate your bladder.  Empty your bladder often. Avoid holding urine for long periods of time.  Empty your bladder before and after sexual intercourse.  After a bowel movement, women should cleanse from front to back. Use each tissue only once. SEEK MEDICAL CARE IF:   You have back pain.  You develop a fever.  Your symptoms do not begin to resolve within 3 days. SEEK IMMEDIATE MEDICAL CARE IF:   You have severe back pain or lower abdominal pain.  You develop chills.  You have nausea or vomiting.  You have continued burning or discomfort with urination. MAKE SURE YOU:   Understand these instructions.  Will watch your condition.  Will get help right away if you are not doing well or get worse. Document Released: 12/18/2004 Document Revised: 09/09/2011 Document Reviewed: 04/18/2011 [facility] Patient Information 2015 [place], [place]. This information is not intended to replace advice given to you by your health care provider. Make sure you discuss any questions you have with your health care provider.

## 2014-03-18 NOTE — ED Provider Notes (Signed)
CSN: [id number]     Arrival date & time 03/18/14  1914 History  This chart was scribed for Veryl Speak, MD by Jeanell Sparrow, ED Scribe. This patient was seen in room MH06/MH06 and the patient's care was started at 9:28 PM.     Chief Complaint  Patient presents with  . Back Pain   Patient is a 28 y.o. female presenting with back pain. The history is provided by the patient. No language interpreter was used.  Back Pain Location:  Lumbar spine Radiates to:  Does not radiate Pain severity:  Moderate Pain is:  Same all the time Onset quality:  Gradual Duration:  1 day Timing:  Constant Progression:  Unchanged Relieved by:  None tried Worsened by:  Nothing tried Ineffective treatments:  None tried Associated symptoms: dysuria   Associated symptoms: no fever    HPI Comments: Kimberly Mcgrath is a 28 y.o. female who presents to the Emergency Department complaining of constant moderate lower back pain that started today. She reports that she also has bilateral flank pain. She states that she suspects that this is due to a UTI, which she has had in past but not of this severity. She describes the pain as a sharp sensation. She reports associated nausea, emesis, chills, dysuria, and urinary frequency. She denies any fever.   Past Medical History  Diagnosis Date  . Kidney infection   . Post partum depression   . Kidney stones   . Headache(784.0)   . Depression   . Chronic UTI (urinary tract infection)   . Cervical cancer    Past Surgical History  Procedure Laterality Date  . Cervix surgery    . Bladder surgery     No family history on file. History  Substance Use Topics  . Smoking status: Current Every Day Smoker  . Smokeless tobacco: Never Used  . Alcohol Use: No   OB History    No data available     Review of Systems  Constitutional: Negative for fever.  Gastrointestinal: Positive for nausea and vomiting.  Genitourinary: Positive for dysuria, frequency and flank pain.   Musculoskeletal: Positive for back pain.  All other systems reviewed and are negative.   Allergies  Aspirin and Sulfa antibiotics  Home Medications   Prior to Admission medications   Medication Sig Start Date End Date Taking? Authorizing Provider  albuterol (PROVENTIL HFA;VENTOLIN HFA) 108 (90 BASE) MCG/ACT inhaler Inhale 1-2 puffs into the lungs every 6 (six) hours as needed for wheezing. 12/06/11 12/05/12  Fredia Sorrow, MD  azithromycin (ZITHROMAX) 250 MG tablet Take 1 tablet (250 mg total) by mouth daily. Take first 2 tablets together, then 1 every day until finished. 11/17/12   Blanchie Dessert, MD  BuPROPion HCl (WELLBUTRIN PO) Take by mouth.    Historical Provider, MD  eletriptan (RELPAX) 40 MG tablet Take 40 mg by mouth as needed for migraine. One tablet by mouth at onset of headache. May repeat in 2 hours if headache persists or recurs.    Historical Provider, MD  esomeprazole (NEXIUM) 40 MG capsule Take 1 capsule (40 mg total) by mouth daily. 11/12/13   Ephraim Hamburger, MD  LINACLOTIDE PO Take 145 mcg by mouth.    Historical Provider, MD  methadone (DOLOPHINE) 10 MG tablet Take 99 mg by mouth every 8 (eight) hours.     Historical Provider, MD  Multiple Vitamins-Minerals (CENTRUM PO) Take 1 tablet by mouth daily.    Historical Provider, MD  nitrofurantoin, macrocrystal-monohydrate, (MACROBID) 100 MG  capsule Take 1 capsule (100 mg total) by mouth 2 (two) times daily. X 7 days 11/12/13   Ephraim Hamburger, MD  omeprazole (PRILOSEC) 40 MG capsule Take 40 mg by mouth daily.    Historical Provider, MD  Probiotic Product (PROBIOTIC DAILY) CAPS Take 1 capsule by mouth daily.    Historical Provider, MD  propranolol ER (INDERAL LA) 60 MG 24 hr capsule Take 60 mg by mouth daily.    Historical Provider, MD  topiramate (TOPAMAX) 50 MG tablet Take 1 tablet (50 mg total) by mouth 2 (two) times daily. Take one tablet daily x 1 week then twice daily 11/03/12   Antony Contras, MD   BP 135/77 mmHg   Pulse 81  Temp(Src) 98.2 F (36.8 C) (Oral)  Resp 18  Ht 5\' 8"  (1.727 m)  Wt 200 lb (90.719 kg)  BMI 30.42 kg/m2  SpO2 97% Physical Exam  Constitutional: She is oriented to person, place, and time. She appears well-developed and well-nourished. No distress.  HENT:  Head: Normocephalic and atraumatic.  Neck: Neck supple. No tracheal deviation present.  Cardiovascular: Normal rate, regular rhythm and normal heart sounds.  Exam reveals no gallop and no friction rub.   No murmur heard. Pulmonary/Chest: Effort normal and breath sounds normal. No respiratory distress. She has no wheezes. She has no rales.  Abdominal: Soft. Bowel sounds are normal. She exhibits no distension. There is tenderness. There is no rebound and no guarding.  Mild SP TTP.   Musculoskeletal: Normal range of motion.  Neurological: She is alert and oriented to person, place, and time.  Skin: Skin is warm and dry.  Psychiatric: She has a normal mood and affect. Her behavior is normal.  Nursing note and vitals reviewed.   ED Course  Procedures (including critical care time) DIAGNOSTIC STUDIES: Oxygen Saturation is 97% on RA, normal by my interpretation.    COORDINATION OF CARE: 9:32 PM- Pt advised of plan for treatment which includes labs and pt agrees.  Labs Review Labs Reviewed  URINALYSIS, ROUTINE W REFLEX MICROSCOPIC - Abnormal; Notable for the following:    Color, Urine RED (*)    APPearance CLOUDY (*)    Hgb urine dipstick MODERATE (*)    Bilirubin Urine SMALL (*)    Ketones, ur 15 (*)    Protein, ur 100 (*)    Urobilinogen, UA 2.0 (*)    Nitrite POSITIVE (*)    Leukocytes, UA MODERATE (*)    All other components within normal limits  URINE MICROSCOPIC-ADD ON - Abnormal; Notable for the following:    Squamous Epithelial / LPF FEW (*)    Bacteria, UA FEW (*)    All other components within normal limits    Imaging Review No results found.   EKG Interpretation None      MDM   Final  diagnoses:  None    Patient with history of UTIs who presents with complaints of low back pain. Urinalysis confirms a recurrent UTI. She tells me she always requires an initial IV dose of antibiotics. She tells me they usually give her Levaquin. She was given IV Levaquin and will be discharged with oral Levaquin. She is to follow-up as needed for any problems. She does not appear septic or toxic, her vitals are stable, and she is afebrile.   I personally performed the services described in this documentation, which was scribed in my presence. The recorded information has been reviewed and is accurate.       Nathaneil Canary  Dario Yono, MD 03/19/14 2013

## 2016-08-16 ENCOUNTER — Encounter (HOSPITAL_BASED_OUTPATIENT_CLINIC_OR_DEPARTMENT_OTHER): Payer: Self-pay | Admitting: Emergency Medicine

## 2016-08-16 ENCOUNTER — Emergency Department (HOSPITAL_BASED_OUTPATIENT_CLINIC_OR_DEPARTMENT_OTHER): Payer: Medicaid Other

## 2016-08-16 ENCOUNTER — Emergency Department (HOSPITAL_BASED_OUTPATIENT_CLINIC_OR_DEPARTMENT_OTHER)
Admission: EM | Admit: 2016-08-16 | Discharge: 2016-08-16 | Disposition: A | Discharge status: Home / Self Care | Payer: Medicaid Other | Attending: Emergency Medicine | Admitting: Emergency Medicine

## 2016-08-16 DIAGNOSIS — Z8541 Personal history of malignant neoplasm of cervix uteri: Secondary | ICD-10-CM | POA: Insufficient documentation

## 2016-08-16 DIAGNOSIS — N3010 Interstitial cystitis (chronic) without hematuria: Secondary | ICD-10-CM | POA: Diagnosis not present

## 2016-08-16 DIAGNOSIS — R300 Dysuria: Secondary | ICD-10-CM | POA: Diagnosis present

## 2016-08-16 DIAGNOSIS — F17200 Nicotine dependence, unspecified, uncomplicated: Secondary | ICD-10-CM | POA: Insufficient documentation

## 2016-08-16 DIAGNOSIS — Z79899 Other long term (current) drug therapy: Secondary | ICD-10-CM | POA: Diagnosis not present

## 2016-08-16 HISTORY — DX: Post-traumatic stress disorder, unspecified: F43.10

## 2016-08-16 HISTORY — DX: Interstitial cystitis (chronic) without hematuria: N30.10

## 2016-08-16 HISTORY — DX: Anxiety disorder, unspecified: F41.9

## 2016-08-16 LAB — URINALYSIS, MICROSCOPIC (REFLEX)

## 2016-08-16 LAB — URINALYSIS, ROUTINE W REFLEX MICROSCOPIC

## 2016-08-16 LAB — PREGNANCY, URINE: Preg Test, Ur: NEGATIVE

## 2016-08-16 MED ORDER — KETOROLAC TROMETHAMINE 60 MG/2ML IM SOLN
60.0000 mg | Freq: Once | INTRAMUSCULAR | Status: AC
  Administered 2016-08-16: 60 mg via INTRAMUSCULAR
  Filled 2016-08-16: qty 2

## 2016-08-16 MED ORDER — ONDANSETRON 4 MG PO TBDP
4.0000 mg | ORAL_TABLET | Freq: Once | ORAL | Status: AC
  Administered 2016-08-16: 4 mg via ORAL
  Filled 2016-08-16: qty 1

## 2016-08-16 MED ORDER — PHENAZOPYRIDINE HCL 200 MG PO TABS: 200.0000 mg | ORAL_TABLET | Freq: Three times a day (TID) | ORAL | 0 refills | Status: AC

## 2016-08-16 NOTE — ED Provider Notes (Signed)
[facility] Provider Note   CSN: [id number] Arrival date & time: 08/16/16  2876 By signing my name below, I, Fabian Sharp, attest that this documentation has been prepared under the direction and in the presence of Blanchie Dessert, MD . Electronically Signed: Fabian Sharp, ED Scribe. 08/16/2016. 4:56 PM.  History   Chief Complaint Chief Complaint  Patient presents with  . Dysuria   HPI Comments:  Trea Carnegie is a 31 y.o. female with a history of interstitial cystitis, chronic UTI, and kidney stones who presents to the Emergency Department complaining of sudden onset, gradually worsening pain to bilateral lumbar back onset two hours PTA. She describes this as throbbing, constant pain with no alleviating factors noted. She reports associated persistent dysuria and increased urinary frequency x2 weeks, as well as nausea onset today. Pt reports taking AZO for her dysuria today immediately before bilateral back pain and nausea began. She also endorses some chronic suprapubic pain which she states is unchanged. Pt has had kidney stones before and believes the pain is similar. LNMP unknown. Pt is not currently sexually active, but states she typically takes Macrobid after intercourse. She last took Levaquin 5 months ago for a UTI. Pt denies any fever, vaginal pain or discharge.   The history is provided by the patient. No language interpreter was used.    Past Medical History:  Diagnosis Date  . Anxiety   . Cervical cancer ([place])   . Chronic UTI (urinary tract infection)   . Depression   . Headache(784.0)   . Interstitial cystitis   . Kidney infection   . Kidney stones   . Post partum depression   . PTSD (post-traumatic stress disorder)     Patient Active Problem List   Diagnosis Date Noted  . Chronic daily headache 11/03/2012  . Migraine without aura, with intractable migraine, so stated, without mention of status migrainosus 11/03/2012  . Muscle tension headache  11/03/2012    Past Surgical History:  Procedure Laterality Date  . BLADDER SURGERY    . CERVIX SURGERY      OB History    No data available       Home Medications    Prior to Admission medications   Medication Sig Start Date End Date Taking? Authorizing Provider  ARIPiprazole (ABILIFY) 10 MG tablet Take 10 mg by mouth daily.   Yes [provider]  BuPROPion HCl (WELLBUTRIN PO) Take by mouth.   Yes [provider]  albuterol (PROVENTIL HFA;VENTOLIN HFA) 108 (90 BASE) MCG/ACT inhaler Inhale 1-2 puffs into the lungs every 6 (six) hours as needed for wheezing. 12/06/11 12/05/12  Fredia Sorrow, MD  azithromycin (ZITHROMAX) 250 MG tablet Take 1 tablet (250 mg total) by mouth daily. Take first 2 tablets together, then 1 every day until finished. 11/17/12   Blanchie Dessert, MD  eletriptan (RELPAX) 40 MG tablet Take 40 mg by mouth as needed for migraine. One tablet by mouth at onset of headache. May repeat in 2 hours if headache persists or recurs.    [provider]  esomeprazole (NEXIUM) 40 MG capsule Take 1 capsule (40 mg total) by mouth daily. 11/12/13   Sherwood Gambler, MD  levofloxacin (LEVAQUIN) 750 MG tablet Take 1 tablet (750 mg total) by mouth daily. X 7 days 03/18/14   Veryl Speak, MD  LINACLOTIDE PO Take 145 mcg by mouth.    [provider]  methadone (DOLOPHINE) 10 MG tablet Take 112 mg by mouth every 8 (eight) hours.  [provider]  Multiple Vitamins-Minerals (CENTRUM PO) Take 1 tablet by mouth daily.    [provider]  nitrofurantoin, macrocrystal-monohydrate, (MACROBID) 100 MG capsule Take 1 capsule (100 mg total) by mouth 2 (two) times daily. X 7 days 11/12/13   Sherwood Gambler, MD  omeprazole (PRILOSEC) 40 MG capsule Take 40 mg by mouth daily.    [provider]  Probiotic Product (PROBIOTIC DAILY) CAPS Take 1 capsule by mouth daily.    [provider]  propranolol ER (INDERAL LA) 60 MG 24 hr  capsule Take 60 mg by mouth daily.    [provider]  topiramate (TOPAMAX) 50 MG tablet Take 1 tablet (50 mg total) by mouth 2 (two) times daily. Take one tablet daily x 1 week then twice daily 11/03/12   Garvin Fila, MD    Family History No family history on file.  Social History Social History  Substance Use Topics  . Smoking status: Current Every Day Smoker  . Smokeless tobacco: Never Used  . Alcohol use No    Allergies   Aspirin and Sulfa antibiotics  Review of Systems Review of Systems All systems reviewed and are negative for acute change except as noted in the HPI.  Physical Exam Updated Vital Signs BP 122/77 (BP Location: Left Arm)   Pulse 67   Temp 98.3 F (36.8 C) (Oral)   Resp 18   Ht 5\' 8"  (1.727 m)   Wt 193 lb (87.5 kg)   SpO2 99%   BMI 29.35 kg/m   Physical Exam  Constitutional: She is oriented to person, place, and time. She appears well-developed and well-nourished. No distress.  HENT:  Head: Normocephalic and atraumatic.  Eyes: Conjunctivae are normal.  Cardiovascular: Normal rate, regular rhythm and normal heart sounds.   Pulmonary/Chest: Effort normal.  Abdominal: She exhibits no distension. There is tenderness in the suprapubic area. There is no CVA tenderness.  Musculoskeletal: She exhibits tenderness.  Bilateral lumbar tenderness  Neurological: She is alert and oriented to person, place, and time.  Skin: Skin is warm and dry.  Nursing note and vitals reviewed.  ED Treatments / Results  DIAGNOSTIC STUDIES:  Oxygen Saturation is 99% on RA, normal by my interpretation.    COORDINATION OF CARE:  4:58 PM Discussed treatment plan which includes Toradol and Zofran with pt at bedside and pt agreed to plan.  Labs (all labs ordered are listed, but only abnormal results are displayed) Labs Reviewed  URINALYSIS, ROUTINE W REFLEX MICROSCOPIC - Abnormal; Notable for the following:       Result Value   Color, Urine RED (*)     APPearance TURBID (*)    Glucose, UA   (*)    Value: TEST NOT REPORTED DUE TO COLOR INTERFERENCE OF URINE PIGMENT   Hgb urine dipstick   (*)    Value: TEST NOT REPORTED DUE TO COLOR INTERFERENCE OF URINE PIGMENT   Bilirubin Urine   (*)    Value: TEST NOT REPORTED DUE TO COLOR INTERFERENCE OF URINE PIGMENT   Ketones, ur   (*)    Value: TEST NOT REPORTED DUE TO COLOR INTERFERENCE OF URINE PIGMENT   Protein, ur   (*)    Value: TEST NOT REPORTED DUE TO COLOR INTERFERENCE OF URINE PIGMENT   Nitrite   (*)    Value: TEST NOT REPORTED DUE TO COLOR INTERFERENCE OF URINE PIGMENT   Leukocytes, UA   (*)    Value: TEST NOT REPORTED DUE TO COLOR  INTERFERENCE OF URINE PIGMENT   All other components within normal limits  URINALYSIS, MICROSCOPIC (REFLEX) - Abnormal; Notable for the following:    Bacteria, UA MANY (*)    Squamous Epithelial / LPF 6-30 (*)    All other components within normal limits  URINE CULTURE  PREGNANCY, URINE    EKG  EKG Interpretation None       Radiology US Renal  Result Date: 08/16/2016 CLINICAL DATA:  Bilateral flank and lower back pain. EXAM: RENAL / URINARY TRACT ULTRASOUND COMPLETE COMPARISON:  12/29/2009 FINDINGS: Right Kidney: Length: 12.2 cm. Echogenicity within normal limits. No mass or hydronephrosis visualized. Left Kidney: Length: 11.5 cm. Echogenicity within normal limits. No mass or hydronephrosis visualized. Bladder: Appears normal for degree of bladder distention. IMPRESSION: Normal renal sonogram. Electronically Signed   By: Kerby Moors M.D.   On: 08/16/2016 17:22    Procedures Procedures (including critical care time)  Medications Ordered in ED Medications - No data to display   Initial Impression / Assessment and Plan / ED Course  I have reviewed the triage vital signs and the nursing notes.  Pertinent labs & imaging results that were available during my care of the patient were reviewed by me and considered in my medical decision making  (see chart for details).     Patient is a 31 year old female with significant past medical history for interstitial cystitis, chronic UTIs and multiple bladder surgeries since she was of a young age. She has chronic dysuria but states it seems to been worse over the last week and a half and today developed severe pain in her lower back with dysuria. She denies fever, vomiting but her urine has looked dark. She does have a history of kidney stones and feels like the pain might be similar but is not unilateral.  She is well-appearing on exam with normal vital signs. She denies any vaginal complaints at this time. Low suspicion for pregnancy as she has an implant.  UA today shows a significant amount of RBCs with many bacteria but no white cells. Urine was cultured but lower suspicion for infection at this time and given patient's multiple resistances to antibiotics will wait for culture. UPT negative. Renal ultrasound without evidence of hydronephrosis. Findings discussed with the patient. Encouraged her to follow-up with her urologist. She was given by radium for dysuria.  Final Clinical Impressions(s) / ED Diagnoses   Final diagnoses:  Interstitial cystitis    New Prescriptions New Prescriptions   PHENAZOPYRIDINE (PYRIDIUM) 200 MG TABLET    Take 1 tablet (200 mg total) by mouth 3 (three) times daily.   I personally performed the services described in this documentation, which was scribed in my presence.  The recorded information has been reviewed and considered.     Blanchie Dessert, MD 08/16/16 [phone]

## 2016-08-16 NOTE — ED Notes (Signed)
Pt discharged to home with family. NAD.  

## 2016-08-16 NOTE — ED Triage Notes (Signed)
Dysuria and frequency x 2 weeks. Pt states she took AZO tablet 2 hours ago and now has back pain and nausea.

## 2016-08-16 NOTE — ED Notes (Signed)
ED Provider at bedside. 

## 2016-08-18 LAB — URINE CULTURE: Culture: <10000 — AB

## 2020-05-31 ENCOUNTER — Encounter (HOSPITAL_BASED_OUTPATIENT_CLINIC_OR_DEPARTMENT_OTHER): Payer: Self-pay | Admitting: Emergency Medicine

## 2020-05-31 ENCOUNTER — Emergency Department (HOSPITAL_BASED_OUTPATIENT_CLINIC_OR_DEPARTMENT_OTHER): Payer: Medicaid Other

## 2020-05-31 ENCOUNTER — Emergency Department (HOSPITAL_BASED_OUTPATIENT_CLINIC_OR_DEPARTMENT_OTHER)
Admission: EM | Admit: 2020-05-31 | Discharge: 2020-05-31 | Disposition: A | Discharge status: Home / Self Care | Payer: Medicaid Other | Attending: Emergency Medicine | Admitting: Emergency Medicine

## 2020-05-31 ENCOUNTER — Other Ambulatory Visit: Payer: Self-pay

## 2020-05-31 DIAGNOSIS — Z79899 Other long term (current) drug therapy: Secondary | ICD-10-CM | POA: Diagnosis not present

## 2020-05-31 DIAGNOSIS — R531 Weakness: Secondary | ICD-10-CM | POA: Diagnosis not present

## 2020-05-31 DIAGNOSIS — R079 Chest pain, unspecified: Secondary | ICD-10-CM

## 2020-05-31 DIAGNOSIS — R040 Epistaxis: Secondary | ICD-10-CM | POA: Diagnosis not present

## 2020-05-31 DIAGNOSIS — R6884 Jaw pain: Secondary | ICD-10-CM | POA: Insufficient documentation

## 2020-05-31 DIAGNOSIS — F17200 Nicotine dependence, unspecified, uncomplicated: Secondary | ICD-10-CM | POA: Insufficient documentation

## 2020-05-31 DIAGNOSIS — F419 Anxiety disorder, unspecified: Secondary | ICD-10-CM | POA: Diagnosis not present

## 2020-05-31 DIAGNOSIS — R519 Headache, unspecified: Secondary | ICD-10-CM | POA: Diagnosis not present

## 2020-05-31 DIAGNOSIS — R0602 Shortness of breath: Secondary | ICD-10-CM | POA: Insufficient documentation

## 2020-05-31 DIAGNOSIS — Z8541 Personal history of malignant neoplasm of cervix uteri: Secondary | ICD-10-CM | POA: Diagnosis not present

## 2020-05-31 LAB — CBC WITH DIFFERENTIAL/PLATELET
Abs Immature Granulocytes: 0.02 10*3/uL (ref 0.00–0.07)
Basophils Absolute: 0 10*3/uL (ref 0.0–0.1)
Basophils Relative: 1 %
Eosinophils Absolute: 0.2 10*3/uL (ref 0.0–0.5)
Eosinophils Relative: 2 %
HCT: 38.9 % (ref 36.0–46.0)
Hemoglobin: 13.1 g/dL (ref 12.0–15.0)
Immature Granulocytes: 0 %
Lymphocytes Relative: 29 %
Lymphs Abs: 2.1 10*3/uL (ref 0.7–4.0)
MCH: 28.5 pg (ref 26.0–34.0)
MCHC: 33.7 g/dL (ref 30.0–36.0)
MCV: 84.7 fL (ref 80.0–100.0)
Monocytes Absolute: 0.5 10*3/uL (ref 0.1–1.0)
Monocytes Relative: 7 %
Neutro Abs: 4.5 10*3/uL (ref 1.7–7.7)
Neutrophils Relative %: 61 %
Platelets: 237 10*3/uL (ref 150–400)
RBC: 4.59 MIL/uL (ref 3.87–5.11)
RDW: 12.8 % (ref 11.5–15.5)
WBC: 7.4 10*3/uL (ref 4.0–10.5)
nRBC: 0 % (ref 0.0–0.2)

## 2020-05-31 LAB — COMPREHENSIVE METABOLIC PANEL
ALT: 23 U/L (ref 0–44)
AST: 19 U/L (ref 15–41)
Albumin: 3.9 g/dL (ref 3.5–5.0)
Alkaline Phosphatase: 61 U/L (ref 38–126)
Anion gap: 9 (ref 5–15)
BUN: 12 mg/dL (ref 6–20)
CO2: 27 mmol/L (ref 22–32)
Calcium: 9.1 mg/dL (ref 8.9–10.3)
Chloride: 102 mmol/L (ref 98–111)
Creatinine, Ser: 0.66 mg/dL (ref 0.44–1.00)
GFR, Estimated: >60 mL/min (ref >60)
Glucose, Bld: 85 mg/dL (ref 70–99)
Potassium: 4 mmol/L (ref 3.5–5.1)
Sodium: 138 mmol/L (ref 135–145)
Total Bilirubin: 0.3 mg/dL (ref 0.3–1.2)
Total Protein: 6.8 g/dL (ref 6.5–8.1)

## 2020-05-31 LAB — URINALYSIS, ROUTINE W REFLEX MICROSCOPIC
Bilirubin Urine: NEGATIVE
Glucose, UA: NEGATIVE mg/dL
Hgb urine dipstick: NEGATIVE
Ketones, ur: NEGATIVE mg/dL
Leukocytes,Ua: NEGATIVE
Nitrite: NEGATIVE
Protein, ur: NEGATIVE mg/dL
Specific Gravity, Urine: 1.02 (ref 1.005–1.030)
pH: 8 (ref 5.0–8.0)

## 2020-05-31 LAB — PREGNANCY, URINE: Preg Test, Ur: NEGATIVE

## 2020-05-31 LAB — TROPONIN I (HIGH SENSITIVITY): Troponin I (High Sensitivity): 2 ng/L (ref <18)

## 2020-05-31 NOTE — Discharge Instructions (Signed)
Follow-up with your doctors as needed. 
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
## 2020-05-31 NOTE — ED Notes (Signed)
ED Provider at bedside.
